# Patient Record
Sex: Male | Born: 1960 | Race: Black or African American | Hispanic: No | Marital: Single | Smoking: Current every day smoker
Health system: Southern US, Community
[De-identification: ages and names within clinical notes are randomized; demographics above are authoritative.]

## PROBLEM LIST (undated history)

## (undated) DIAGNOSIS — I10 Essential (primary) hypertension: Secondary | ICD-10-CM

## (undated) DIAGNOSIS — F1910 Other psychoactive substance abuse, uncomplicated: Secondary | ICD-10-CM

## (undated) DIAGNOSIS — B20 Human immunodeficiency virus [HIV] disease: Secondary | ICD-10-CM

## (undated) DIAGNOSIS — F32A Depression, unspecified: Secondary | ICD-10-CM

## (undated) DIAGNOSIS — F99 Mental disorder, not otherwise specified: Secondary | ICD-10-CM

## (undated) DIAGNOSIS — F329 Major depressive disorder, single episode, unspecified: Secondary | ICD-10-CM

## (undated) DIAGNOSIS — Z8659 Personal history of other mental and behavioral disorders: Secondary | ICD-10-CM

## (undated) DIAGNOSIS — Z21 Asymptomatic human immunodeficiency virus [HIV] infection status: Secondary | ICD-10-CM

## (undated) HISTORY — PX: FRACTURE SURGERY: SHX138

## (undated) HISTORY — PX: CHOLECYSTECTOMY: SHX55

---

## 1999-12-08 ENCOUNTER — Emergency Department (HOSPITAL_COMMUNITY): Admission: EM | Admit: 1999-12-08 | Discharge: 1999-12-08 | Payer: Self-pay | Admitting: Emergency Medicine

## 1999-12-16 ENCOUNTER — Emergency Department (HOSPITAL_COMMUNITY): Admission: EM | Admit: 1999-12-16 | Discharge: 1999-12-16 | Payer: Self-pay | Admitting: Emergency Medicine

## 1999-12-19 ENCOUNTER — Ambulatory Visit (HOSPITAL_BASED_OUTPATIENT_CLINIC_OR_DEPARTMENT_OTHER): Admission: RE | Admit: 1999-12-19 | Discharge: 1999-12-19 | Payer: Self-pay | Admitting: Surgery

## 2013-02-03 ENCOUNTER — Encounter (HOSPITAL_COMMUNITY): Payer: Self-pay | Admitting: *Deleted

## 2013-02-03 ENCOUNTER — Emergency Department (HOSPITAL_COMMUNITY)
Admission: EM | Admit: 2013-02-03 | Discharge: 2013-02-03 | Disposition: A | Discharge status: Home / Self Care | Payer: Medicare Other | Attending: Emergency Medicine | Admitting: Emergency Medicine

## 2013-02-03 ENCOUNTER — Emergency Department (HOSPITAL_COMMUNITY): Payer: Medicare Other

## 2013-02-03 DIAGNOSIS — S63509A Unspecified sprain of unspecified wrist, initial encounter: Secondary | ICD-10-CM | POA: Insufficient documentation

## 2013-02-03 DIAGNOSIS — W010XXA Fall on same level from slipping, tripping and stumbling without subsequent striking against object, initial encounter: Secondary | ICD-10-CM | POA: Insufficient documentation

## 2013-02-03 DIAGNOSIS — S63501A Unspecified sprain of right wrist, initial encounter: Secondary | ICD-10-CM

## 2013-02-03 DIAGNOSIS — I10 Essential (primary) hypertension: Secondary | ICD-10-CM | POA: Insufficient documentation

## 2013-02-03 DIAGNOSIS — S62306A Unspecified fracture of fifth metacarpal bone, right hand, initial encounter for closed fracture: Secondary | ICD-10-CM

## 2013-02-03 DIAGNOSIS — IMO0002 Reserved for concepts with insufficient information to code with codable children: Secondary | ICD-10-CM | POA: Insufficient documentation

## 2013-02-03 DIAGNOSIS — Y939 Activity, unspecified: Secondary | ICD-10-CM | POA: Insufficient documentation

## 2013-02-03 DIAGNOSIS — S62309A Unspecified fracture of unspecified metacarpal bone, initial encounter for closed fracture: Secondary | ICD-10-CM | POA: Insufficient documentation

## 2013-02-03 DIAGNOSIS — Z21 Asymptomatic human immunodeficiency virus [HIV] infection status: Secondary | ICD-10-CM | POA: Insufficient documentation

## 2013-02-03 DIAGNOSIS — Y92009 Unspecified place in unspecified non-institutional (private) residence as the place of occurrence of the external cause: Secondary | ICD-10-CM | POA: Insufficient documentation

## 2013-02-03 DIAGNOSIS — F17200 Nicotine dependence, unspecified, uncomplicated: Secondary | ICD-10-CM | POA: Insufficient documentation

## 2013-02-03 HISTORY — DX: Essential (primary) hypertension: I10

## 2013-02-03 HISTORY — DX: Human immunodeficiency virus (HIV) disease: B20

## 2013-02-03 HISTORY — DX: Asymptomatic human immunodeficiency virus (hiv) infection status: Z21

## 2013-02-03 MED ORDER — HYDROCODONE-ACETAMINOPHEN 5-325 MG PO TABS: 1.0000 | ORAL_TABLET | Freq: Four times a day (QID) | ORAL | Status: DC | PRN

## 2013-02-03 MED ORDER — HYDROCODONE-ACETAMINOPHEN 5-325 MG PO TABS
2.0000 | ORAL_TABLET | Freq: Once | ORAL | Status: AC
  Administered 2013-02-03: 2 via ORAL
  Filled 2013-02-03: qty 2

## 2013-02-03 NOTE — ED Notes (Signed)
Pt reports falling backwards last night and catching self with hands. C/o R wrist pain.

## 2013-02-03 NOTE — ED Provider Notes (Addendum)
History     CSN: [id number]  Arrival date & time 02/03/13  4098   First MD Initiated Contact with Patient 02/03/13 [phone]      Chief Complaint  Patient presents with  . Wrist Pain    (Consider location/radiation/quality/duration/timing/severity/associated sxs/prior treatment) HPI Pt with hx hiv, states slipped on ice at friends house last pm. L[place]y Seat back. Braced fall w outstretched right hand. C/o constant, dull, moderate, non radiating right hand and wrist pain since fall. Superficial abrasion to palm of right hand. Tetanus utd. No elbow or shoulder pain. Denies head injury or headache. No neck or back pain. No numbness/weakness. Denies any other pain or injury. States otherwise recent health at baseline.   Past Medical History  Diagnosis Date  . HIV (human immunodeficiency virus infection)   . Hypertension     Past Surgical History  Procedure Laterality Date  . Cholecystectomy      No family history on file.  History  Substance Use Topics  . Smoking status: Current Every Day Smoker  . Smokeless tobacco: Not on file  . Alcohol Use: Yes     Comment: 1-2x/wk      Review of Systems  Constitutional: Negative for fever.  HENT: Negative for neck pain.   Gastrointestinal: Negative for nausea, vomiting and abdominal pain.  Musculoskeletal: Negative for back pain.  Skin: Positive for wound.  Neurological: Negative for weakness, numbness and headaches.    Allergies  Review of patient's allergies indicates no known allergies.  Home Medications  No current outpatient prescriptions on file.  BP 113/83  Pulse 70  Temp(Src) 97.5 F (36.4 C) (Oral)  Resp 18  SpO2 99%  Physical Exam  Nursing note and vitals reviewed. Constitutional: He is oriented to person, place, and time. He appears well-developed and well-nourished. No distress.  HENT:  Head: Atraumatic.  Eyes: Conjunctivae are normal.  Neck: Normal range of motion. Neck supple. No tracheal deviation present.   Cardiovascular: Normal rate.   Pulmonary/Chest: Effort normal. No accessory muscle usage. No respiratory distress. He exhibits no tenderness.  Abdominal: Soft. He exhibits no distension. There is no tenderness.  Musculoskeletal: Normal range of motion.  Tenderness right wrist and hand. Very small 2-3 mm, ver superficial abrasion to palm of right hand at base of hypothenar region. Radial pulse 2+, normal cap refill distally.  CTLS spine, non tender, aligned, no step off.   Neurological: He is alert and oriented to person, place, and time.  Motor intact bil. Steady gait. R/U/M m fxn intact rue.   Skin: Skin is warm and dry.  Psychiatric: He has a normal mood and affect.    ED Course  Procedures (including critical care time)   Dg Wrist Complete Right  02/03/2013  *RADIOLOGY REPORT*  Clinical Data: Pain post fall  RIGHT WRIST - COMPLETE 3+ VIEW  Comparison: None.  Findings: Five views of the right wrist submitted. No acute fracture or subluxation.  Old fracture of the ulnar styloid.  Narrowing of radiocarpal joint space.  Old appearing fracture of the fifth metacarpal.  IMPRESSION: No acute fracture or subluxation.  Old fracture of the ulnar styloid.  Narrowing of radiocarpal joint space.   Original Report Authenticated By: Natasha Mead, M.D.    Dg Hand Complete Right  02/03/2013  *RADIOLOGY REPORT*  Clinical Data: Hand pain  RIGHT HAND - COMPLETE 3+ VIEW  Comparison: None.  Findings: Three views of the right hand submitted. No acute fracture or subluxation.  Old appearing fracture of the fifth  metacarpal.  IMPRESSION: No acute fracture or subluxation.  Old appearing fracture of fifth metacarpal.  Clinical correlation is necessary.   Original Report Authenticated By: Natasha Mead, M.D.       MDM  Xrays.  No meds pta. Took ambulance here. vicodin po.  Reviewed nursing notes and prior charts for additional history.   Recheck no focal scaphoid tenderness.  Pt does have tenderness over 5th  metacarpal, and denies known prior fracture of hand.  Xray results noted, ?possible acute fx. Given tenderness in region, will have ortho tech apply a ulnar gutter splint, ortho f/u.  Discussed possible fx w pt and plan for f/u.         Suzi Roots, MD 02/03/13 1610  Suzi Roots, MD 02/03/13 [phone]

## 2013-02-03 NOTE — ED Notes (Signed)
Patient transported to X-ray 

## 2013-02-03 NOTE — ED Notes (Signed)
ortho notified about splint order.

## 2013-03-07 ENCOUNTER — Emergency Department (HOSPITAL_COMMUNITY)
Admission: EM | Admit: 2013-03-07 | Discharge: 2013-03-08 | Disposition: A | Discharge status: Other Acute Inpatient Hospital | Payer: Medicare Other | Source: Home / Self Care | Attending: Emergency Medicine | Admitting: Emergency Medicine

## 2013-03-07 ENCOUNTER — Encounter (HOSPITAL_COMMUNITY): Payer: Self-pay | Admitting: Emergency Medicine

## 2013-03-07 DIAGNOSIS — R45851 Suicidal ideations: Secondary | ICD-10-CM

## 2013-03-07 HISTORY — DX: Major depressive disorder, single episode, unspecified: F32.9

## 2013-03-07 HISTORY — DX: Depression, unspecified: F32.A

## 2013-03-07 HISTORY — DX: Mental disorder, not otherwise specified: F99

## 2013-03-07 HISTORY — DX: Personal history of other mental and behavioral disorders: Z86.59

## 2013-03-07 HISTORY — DX: Other psychoactive substance abuse, uncomplicated: F19.10

## 2013-03-07 LAB — RAPID URINE DRUG SCREEN, HOSP PERFORMED
Barbiturates: NOT DETECTED
Benzodiazepines: NOT DETECTED
Cocaine: POSITIVE — AB

## 2013-03-07 LAB — CBC WITH DIFFERENTIAL/PLATELET
Band Neutrophils: 0 % (ref 0–10)
Eosinophils Relative: 4 % (ref 0–5)
HCT: 35.2 % — ABNORMAL LOW (ref 39.0–52.0)
Hemoglobin: 13 g/dL (ref 13.0–17.0)
Lymphocytes Relative: 32 % (ref 12–46)
Lymphs Abs: 1.2 10*3/uL (ref 0.7–4.0)
MCH: 33 pg (ref 26.0–34.0)
MCV: 89.3 fL (ref 78.0–100.0)
Monocytes Absolute: 0.4 10*3/uL (ref 0.1–1.0)
Monocytes Relative: 12 % (ref 3–12)
RBC: 3.94 MIL/uL — ABNORMAL LOW (ref 4.22–5.81)

## 2013-03-07 LAB — COMPREHENSIVE METABOLIC PANEL
CO2: 25 mEq/L (ref 19–32)
Calcium: 8.5 mg/dL (ref 8.4–10.5)
Chloride: 102 mEq/L (ref 96–112)
Creatinine, Ser: 0.94 mg/dL (ref 0.50–1.35)
GFR calc Af Amer: >90 mL/min (ref >90)
GFR calc non Af Amer: >90 mL/min (ref >90)
Glucose, Bld: 102 mg/dL — ABNORMAL HIGH (ref 70–99)
Total Bilirubin: 0.8 mg/dL (ref 0.3–1.2)

## 2013-03-07 LAB — ETHANOL: Alcohol, Ethyl (B): <11 mg/dL (ref 0–11)

## 2013-03-07 LAB — SALICYLATE LEVEL: Salicylate Lvl: 2 mg/dL — ABNORMAL LOW (ref 2.8–20.0)

## 2013-03-07 MED ORDER — HYDROCODONE-ACETAMINOPHEN 5-325 MG PO TABS: 1.0000 | ORAL_TABLET | Freq: Four times a day (QID) | ORAL | Status: DC | PRN

## 2013-03-07 MED ORDER — ABACAVIR SULFATE-LAMIVUDINE 600-300 MG PO TABS
1.0000 | ORAL_TABLET | Freq: Every day | ORAL | Status: DC
  Filled 2013-03-07 (×2): qty 1

## 2013-03-07 MED ORDER — ONDANSETRON HCL 8 MG PO TABS: 4.0000 mg | ORAL_TABLET | Freq: Three times a day (TID) | ORAL | Status: DC | PRN

## 2013-03-07 MED ORDER — LORAZEPAM 1 MG PO TABS: 1.0000 mg | ORAL_TABLET | Freq: Three times a day (TID) | ORAL | Status: DC | PRN

## 2013-03-07 MED ORDER — DARUNAVIR ETHANOLATE 800 MG PO TABS
800.0000 mg | ORAL_TABLET | Freq: Every day | ORAL | Status: DC
  Administered 2013-03-07: 800 mg via ORAL
  Filled 2013-03-07: qty 1

## 2013-03-07 MED ORDER — NICOTINE 14 MG/24HR TD PT24
14.0000 mg | MEDICATED_PATCH | Freq: Every day | TRANSDERMAL | Status: DC
  Filled 2013-03-07 (×2): qty 1

## 2013-03-07 MED ORDER — ACETAMINOPHEN 325 MG PO TABS: 650.0000 mg | ORAL_TABLET | ORAL | Status: DC | PRN

## 2013-03-07 MED ORDER — ZOLPIDEM TARTRATE 5 MG PO TABS: 5.0000 mg | ORAL_TABLET | Freq: Every evening | ORAL | Status: DC | PRN

## 2013-03-07 MED ORDER — ABACAVIR SULFATE 300 MG PO TABS
600.0000 mg | ORAL_TABLET | Freq: Every day | ORAL | Status: DC
  Administered 2013-03-07: 600 mg via ORAL
  Filled 2013-03-07: qty 2

## 2013-03-07 MED ORDER — AMLODIPINE BESYLATE 10 MG PO TABS
10.0000 mg | ORAL_TABLET | Freq: Every day | ORAL | Status: DC
  Administered 2013-03-07: 10 mg via ORAL
  Filled 2013-03-07 (×2): qty 1

## 2013-03-07 MED ORDER — ATENOLOL 25 MG PO TABS
50.0000 mg | ORAL_TABLET | Freq: Every day | ORAL | Status: DC
  Administered 2013-03-07: 50 mg via ORAL
  Filled 2013-03-07: qty 2

## 2013-03-07 MED ORDER — RITONAVIR 100 MG PO CAPS
100.0000 mg | ORAL_CAPSULE | Freq: Every day | ORAL | Status: DC
  Administered 2013-03-07: 100 mg via ORAL
  Filled 2013-03-07: qty 1

## 2013-03-07 MED ORDER — LAMIVUDINE 150 MG PO TABS
300.0000 mg | ORAL_TABLET | Freq: Every day | ORAL | Status: DC
  Administered 2013-03-07: 300 mg via ORAL
  Filled 2013-03-07: qty 2

## 2013-03-07 MED ORDER — POTASSIUM CHLORIDE CRYS ER 20 MEQ PO TBCR
20.0000 meq | EXTENDED_RELEASE_TABLET | Freq: Every day | ORAL | Status: DC
  Administered 2013-03-07: 20 meq via ORAL
  Filled 2013-03-07: qty 1

## 2013-03-07 MED ORDER — ALUM & MAG HYDROXIDE-SIMETH 200-200-20 MG/5ML PO SUSP: 30.0000 mL | ORAL | Status: DC | PRN

## 2013-03-07 MED ORDER — IBUPROFEN 200 MG PO TABS: 600.0000 mg | ORAL_TABLET | Freq: Three times a day (TID) | ORAL | Status: DC | PRN

## 2013-03-07 NOTE — ED Provider Notes (Signed)
Patient reports feeling suicidal since this morning. He has thought about jumping out in front of a car. Patient is alert awake depressed affect . Medical decision making feel the patient requires inpatient psychiatric evaluation and therapy . Will obtain telemetry psychiatry consult Act consult for placement  Doug Sou, MD 03/07/13 1754

## 2013-03-07 NOTE — ED Notes (Signed)
Pt reports woke up with suicidal thoughts this morning. Pt reports trigger occurred due to arguing with family over HIV status. Pt reports was staying with his sister, who commented while he was cooking that he could be putting blood in the food. Pt stayed with cousin and when pt told cousin about his HIV status her response was that he needed to find somewhere else to live. Pt has no other housing options. Pt plan is to jump in front of a car.

## 2013-03-07 NOTE — ED Notes (Signed)
RN spoke with on-call telepsych psychiatrist. Psychiatrist is now speaking with pt in room. Sitter is present at the bedside.

## 2013-03-07 NOTE — ED Provider Notes (Deleted)
Medical screening examination/treatment/procedure(s) were performed by non-physician practitioner and as supervising physician I was immediately available for consultation/collaboration.  Doug Sou, MD 03/07/13 [phone]

## 2013-03-07 NOTE — BH Assessment (Addendum)
Assessment Note   Sean Robinson is an 52 y.o. male that presented to [facility] voluntarily with SI and a plan to "to jump in front of a car". Pt said "I'm not safe out there, I need to be inpatient, I don't feel safe". Pt denies HI, AVH, or psychosis. Pt is oriented x's 4, alert, calm and cooperative and tearful during assessment. Pt denies sexual, physical or emotional abuse. Pt confirms a sa hx with an etoh onset at 52 yo and a crack cocaine onset at 52 yo; with last use of both on 03/05/13. Pt confirms that "I smoke five cigarettes a day". Pt reports that his recent memory is intact and his remote memory is not intact. Pt confirms depressive symptoms of hopeless, fatigue, guilt, tearful, loss of interest in usual pleasure, feeling irritable, feeling worthless and self-pity and insomnia (5-6/24). Pt denies any family hx of suicide or mh problems. Pt confirms no pain (0/10) and he is able to complete all ADL's without assistance. Pt reports that his support system is nonexistent and when asked if his family support him he said "no, none of them". Pt reports recent stress and conflict was telling his family his HIV status. Pt reports that he has never attempted in the past, yet he has been hospitalized for SI and depression. Pt has 2 inpatient for SI and depression ([facility] & [facility]-[facility] hospital) and 2 inpatient for [facility] (30 yrs ago) & [facility]-not sure of name). Pt reports that he was dx'd with HIV in 1996 and said "I see Dr. Jenelle Mages at [facility] and when I saw her last month I was undetected (viral load) and 450 (T-Cell). Pt was put out of his nieces home in [place] because of his status and he moved to [place] with a cousin. Pt reports that when he told his cousin, "I was told that I needed to find somewhere else to live". Pt reports he is not able to live with any of his family, as one of them made a public announcement outside of where he was living. Pt said that he needs to find housing and "I get a  SSI check monthly". Denice Bors, AADC 03/07/2013 6:45 PM  Axis I: Major Depression, Recurrent severe Axis II: Deferred Axis III:  Past Medical History  Diagnosis Date  . HIV (human immunodeficiency virus infection)   . Hypertension   . Mental disorder   . Depression   . H/O psychiatric hospitalization     [facility], [place] - 2007 and [facility] - 2008  . Polysubstance abuse     ETOH and crack   Axis IV: housing problems, other psychosocial or environmental problems, problems related to social environment and problems with primary support group Axis V: 31-40 impairment in reality testing  Past Medical History:  Past Medical History  Diagnosis Date  . HIV (human immunodeficiency virus infection)   . Hypertension   . Mental disorder   . Depression   . H/O psychiatric hospitalization     [facility], [place] - 2007 and [facility] - 2008  . Polysubstance abuse     ETOH and crack    Past Surgical History  Procedure Laterality Date  . Cholecystectomy      Family History: No family history on file.  Social History:  reports that he has been smoking.  He does not have any smokeless tobacco history on file. He reports that  drinks alcohol. He reports that he uses illicit drugs (  Cocaine).  Additional Social History:  Alcohol / Drug Use Pain Medications: pt denies Prescriptions: pt denies Over the Counter: pt denies History of alcohol / drug use?: Yes Longest period of sobriety (when/how long): pt unsure Negative Consequences of Use: Financial;Personal relationships Substance #1 Name of Substance 1: etoh 1 - Age of First Use: 12 1 - Last Use / Amount: 03/05/13 Substance #2 Name of Substance 2: crack cocaine 2 - Age of First Use: 26 2 - Last Use / Amount: 03/05/13  CIWA: CIWA-Ar BP: 130/86 mmHg (Simultaneous filing. User may not have seen previous data.) Pulse Rate: 64 COWS:    Allergies: No Known Allergies  Home Medications:  (Not in a hospital  admission)  OB/GYN Status:  No LMP for male patient.  General Assessment Data Location of Assessment: [facility] ED Living Arrangements: Other (Comment) (recently homeless) Can pt return to current living arrangement?: No Admission Status: Voluntary Is patient capable of signing voluntary admission?: Yes Transfer from: Home Referral Source: Self/Family/Friend     Risk to self Suicidal Ideation: Yes-Currently Present Suicidal Intent: No Is patient at risk for suicide?: Yes Suicidal Plan?: Yes-Currently Present Specify Current Suicidal Plan:  (pt reports that he would step in front of a car) Access to Means: Yes Specify Access to Suicidal Means:  (pt would step in path of motor vehicle) What has been your use of drugs/alcohol within the last 12 months?:  (etoh, crack cocaine) Previous Attempts/Gestures: No How many times?:  (0) Other Self Harm Risks:  (none noted) Triggers for Past Attempts: None known Intentional Self Injurious Behavior: None Family Suicide History: No Recent stressful life event(s): Conflict (Comment);Financial Problems (family conflict) Persecutory voices/beliefs?: No Depression: Yes Depression Symptoms: Fatigue;Isolating;Tearfulness;Insomnia;Guilt;Loss of interest in usual pleasures;Feeling worthless/self pity;Feeling angry/irritable (pt reports being irritable, not angry) Substance abuse history and/or treatment for substance abuse?: Yes Suicide prevention information given to non-admitted patients: Not applicable  Risk to Others Homicidal Ideation: No Thoughts of Harm to Others: No Current Homicidal Intent: No Current Homicidal Plan: No Access to Homicidal Means: No Identified Victim:  (none noted) History of harm to others?: No Assessment of Violence: None Noted Violent Behavior Description:  (none noted, pt calm and cooperative) Does patient have access to weapons?: No Criminal Charges Pending?: No Does patient have a court date:  No  Psychosis Hallucinations: None noted Delusions: None noted  Mental Status Report Appear/Hygiene:  (pt in hospital wear) Eye Contact: Good Motor Activity: Freedom of movement Speech: Logical/coherent Level of Consciousness: Alert Mood: Depressed;Sad (tearful during assessment) Affect: Appropriate to circumstance Anxiety Level: Moderate Thought Processes: Coherent;Relevant Judgement: Unimpaired Orientation: Person;Place;Time;Situation;Appropriate for developmental age Obsessive Compulsive Thoughts/Behaviors: None  Cognitive Functioning Concentration: Decreased Memory: Recent Intact;Remote Impaired IQ: Average Insight: Fair Impulse Control: Poor Appetite: Fair Weight Loss:  (0) Weight Gain:  (0) Sleep: Decreased Total Hours of Sleep:  (5-6/24) Vegetative Symptoms: Not bathing;Decreased grooming  ADLScreening [facility] Assessment Services) Patient's cognitive ability adequate to safely complete daily activities?: Yes Patient able to express need for assistance with ADLs?: Yes Independently performs ADLs?: Yes (appropriate for developmental age)  Abuse/Neglect [facility]) Physical Abuse: Denies Verbal Abuse: Denies Sexual Abuse: Denies  Prior Inpatient Therapy Prior Inpatient Therapy: Yes Prior Therapy Dates:  (2011, 2010, 1984) Prior Therapy Facilty/Provider(s):  ([facility], [place], [place]) Reason for Treatment:  (depression x'2, sa x'1)  Prior Outpatient Therapy Prior Outpatient Therapy: Yes Prior Therapy Dates:  (2004) Prior Therapy Facilty/Provider(s):  [facility]) Reason for Treatment:  (detox)  ADL Screening (condition at time  of admission) Patient's cognitive ability adequate to safely complete daily activities?: Yes Patient able to express need for assistance with ADLs?: Yes Independently performs ADLs?: Yes (appropriate for developmental age) Weakness of Legs: None Weakness of Arms/Hands: None  Home Assistive Devices/Equipment Home Assistive  Devices/Equipment: None  Therapy Consults (therapy consults require a physician order) PT Evaluation Needed: No OT Evalulation Needed: No SLP Evaluation Needed: No Abuse/Neglect Assessment (Assessment to be complete while patient is alone) Physical Abuse: Denies Verbal Abuse: Denies Sexual Abuse: Denies Exploitation of patient/patient's resources: Denies Self-Neglect: Denies Values / Beliefs Cultural Requests During Hospitalization: None Spiritual Requests During Hospitalization: None Consults Spiritual Care Consult Needed: No Social Work Consult Needed: No Merchant navy officer (For Healthcare) Advance Directive: Patient does not have advance directive Pre-existing out of facility DNR order (yellow form or pink MOST form): No Nutrition Screen- [facility] Adult/WL/AP Patient's home diet: Regular Have you recently lost weight without trying?: No Have you been eating poorly because of a decreased appetite?: No Malnutrition Screening Tool Score: 0  Additional Information 1:1 In Past 12 Months?: No CIRT Risk: No Elopement Risk: No Does patient have medical clearance?: Yes     Disposition: Dr. Felix Pacini will place order for tele-psych. Disposition Initial Assessment Completed for this Encounter: Yes Disposition of Patient: Inpatient treatment program Type of inpatient treatment program: Adult  On Site Evaluation by:   Reviewed with Physician:     Manual Meier 03/07/2013 6:11 PM

## 2013-03-07 NOTE — ED Notes (Signed)
Pt ambulated to restroom. 

## 2013-03-07 NOTE — ED Provider Notes (Signed)
Assumed care of the patient from [place].  The patient was awaiting lab results.  He appears to be stable for psych eval for suicidal ideation with plan.  I have given report to the act team. 5:00 PM BP 131/86  Pulse 69  Temp(Src) 97.8 F (36.6 C) (Oral)  Resp 18  SpO2 98%   Arthor Captain, PA-C 03/07/13 1700

## 2013-03-07 NOTE — ED Provider Notes (Signed)
History     CSN: [id number]  Arrival date & time 03/07/13  1424   First MD Initiated Contact with Patient 03/07/13 1426      Chief Complaint  Patient presents with  . Suicidal    (Consider location/radiation/quality/duration/timing/severity/associated sxs/prior treatment) HPI Comments: Patient reports his family has kicked him out due to his HIV status.  States his niece yelled his HIV status to the entire neighborhood at his sister's house, so he had to leave town.  He then moved to [place] with his cousin, and when his cousin found out he was HIV positive he was kicked out.  States he stayed on the street where he drank ETOH and did crack, though this is not a daily habit for him.  States he is having suicidal thoughts and plans to jump in front of a car.  States "The HIV is killing me anyway, so I might as well go ahead and get it over with."  Denies HI.    The history is provided by the patient.    Past Medical History  Diagnosis Date  . HIV (human immunodeficiency virus infection)   . Hypertension     Past Surgical History  Procedure Laterality Date  . Cholecystectomy      No family history on file.  History  Substance Use Topics  . Smoking status: Current Every Day Smoker  . Smokeless tobacco: Not on file  . Alcohol Use: Yes     Comment: 1-2x/wk      Review of Systems  Constitutional: Negative for fever and chills.  HENT: Positive for congestion. Negative for sore throat.   Respiratory: Positive for cough. Negative for shortness of breath.        Regarding cough and nasal congestion; has "summer cold" - described as mild.   Cardiovascular: Negative for chest pain.  Gastrointestinal: Negative for nausea, vomiting, abdominal pain and diarrhea.  Neurological: Negative for weakness and numbness.    Allergies  Review of patient's allergies indicates no known allergies.  Home Medications   Current Outpatient Rx  Name  Route  Sig  Dispense  Refill  .  abacavir-lamiVUDine (EPZICOM) 600-300 MG per tablet   Oral   Take 1 tablet by mouth daily.         Marland Kitchen amLODipine (NORVASC) 10 MG tablet   Oral   Take 10 mg by mouth daily.         Marland Kitchen atenolol (TENORMIN) 50 MG tablet   Oral   Take 50 mg by mouth daily.         . Darunavir Ethanolate (PREZISTA) 800 MG tablet   Oral   Take 800 mg by mouth daily.         Marland Kitchen HYDROcodone-acetaminophen (NORCO/VICODIN) 5-325 MG per tablet   Oral   Take 1-2 tablets by mouth every 6 (six) hours as needed for pain.         . potassium chloride SA (K-DUR,KLOR-CON) 20 MEQ tablet   Oral   Take 20 mEq by mouth daily.         . ritonavir (NORVIR) 100 MG capsule   Oral   Take 100 mg by mouth daily.           BP 134/84  Pulse 86  Temp(Src) 97.8 F (36.6 C) (Oral)  Resp 18  SpO2 98%  Physical Exam  Nursing note and vitals reviewed. Constitutional: He appears well-developed and well-nourished. No distress.  HENT:  Head: Normocephalic and atraumatic.  Neck: Neck supple.  Cardiovascular: Normal rate and regular rhythm.   Pulmonary/Chest: Effort normal and breath sounds normal. No respiratory distress. He has no wheezes. He has no rales.  Abdominal: Soft. He exhibits no distension and no mass. There is no tenderness. There is no rebound and no guarding.  Musculoskeletal:       Right knee: He exhibits swelling. He exhibits normal range of motion, no deformity, normal alignment, no LCL laxity and no MCL laxity.  Lower extremities:  Strength 5/5, sensation intact, distal pulses intact.     Neurological: He is alert. He exhibits normal muscle tone.  Skin: He is not diaphoretic.  Psychiatric: He is slowed. He exhibits a depressed mood. He expresses suicidal ideation. He expresses suicidal plans.    ED Course  Procedures (including critical care time)  Labs Reviewed  CBC WITH DIFFERENTIAL  COMPREHENSIVE METABOLIC PANEL  URINE RAPID DRUG SCREEN (HOSP PERFORMED)  ETHANOL  ACETAMINOPHEN  LEVEL  SALICYLATE LEVEL   No results found.   1. Suicidal ideation     MDM  Pt with suicidal ideation and plan.  Pt is voluntary at present.  Discussed patient with Arthor Captain, PA-C, who assumes care of patient at change of shift pending all labs and consult to ACT team for placement.         Trixie Dredge, PA-C 03/07/13 1526

## 2013-03-07 NOTE — ED Provider Notes (Signed)
Medical screening examination/treatment/procedure(s) were performed by non-physician practitioner and as supervising physician I was immediately available for consultation/collaboration.   Richardean Canal, MD 03/07/13 2253

## 2013-03-08 ENCOUNTER — Inpatient Hospital Stay (HOSPITAL_COMMUNITY)
Admission: EM | Admit: 2013-03-08 | Discharge: 2013-03-12 | DRG: 881 | Payer: Medicare Other | Source: Intra-hospital | Attending: Psychiatry | Admitting: Psychiatry

## 2013-03-08 ENCOUNTER — Encounter (HOSPITAL_COMMUNITY): Payer: Self-pay | Admitting: *Deleted

## 2013-03-08 DIAGNOSIS — F332 Major depressive disorder, recurrent severe without psychotic features: Secondary | ICD-10-CM

## 2013-03-08 DIAGNOSIS — F1410 Cocaine abuse, uncomplicated: Secondary | ICD-10-CM | POA: Diagnosis present

## 2013-03-08 DIAGNOSIS — F329 Major depressive disorder, single episode, unspecified: Principal | ICD-10-CM | POA: Diagnosis present

## 2013-03-08 DIAGNOSIS — Z21 Asymptomatic human immunodeficiency virus [HIV] infection status: Secondary | ICD-10-CM | POA: Diagnosis present

## 2013-03-08 DIAGNOSIS — R45851 Suicidal ideations: Secondary | ICD-10-CM

## 2013-03-08 DIAGNOSIS — I10 Essential (primary) hypertension: Secondary | ICD-10-CM | POA: Diagnosis present

## 2013-03-08 DIAGNOSIS — F3289 Other specified depressive episodes: Principal | ICD-10-CM | POA: Diagnosis present

## 2013-03-08 DIAGNOSIS — Z79899 Other long term (current) drug therapy: Secondary | ICD-10-CM

## 2013-03-08 DIAGNOSIS — F4323 Adjustment disorder with mixed anxiety and depressed mood: Secondary | ICD-10-CM

## 2013-03-08 MED ORDER — SERTRALINE HCL 50 MG PO TABS
50.0000 mg | ORAL_TABLET | Freq: Every day | ORAL | Status: DC
  Administered 2013-03-08 – 2013-03-12 (×5): 50 mg via ORAL
  Filled 2013-03-08 (×8): qty 1

## 2013-03-08 MED ORDER — POTASSIUM CHLORIDE CRYS ER 20 MEQ PO TBCR
20.0000 meq | EXTENDED_RELEASE_TABLET | Freq: Every day | ORAL | Status: DC
  Administered 2013-03-08 – 2013-03-12 (×5): 20 meq via ORAL
  Filled 2013-03-08 (×7): qty 1

## 2013-03-08 MED ORDER — OLANZAPINE 5 MG PO TABS: 5.0000 mg | ORAL_TABLET | Freq: Two times a day (BID) | ORAL | Status: DC | PRN

## 2013-03-08 MED ORDER — DARUNAVIR ETHANOLATE 800 MG PO TABS
800.0000 mg | ORAL_TABLET | Freq: Every day | ORAL | Status: DC
  Administered 2013-03-08 – 2013-03-12 (×5): 800 mg via ORAL
  Filled 2013-03-08 (×6): qty 1

## 2013-03-08 MED ORDER — ABACAVIR SULFATE 300 MG PO TABS
600.0000 mg | ORAL_TABLET | Freq: Every day | ORAL | Status: DC
  Administered 2013-03-08 – 2013-03-12 (×5): 600 mg via ORAL
  Filled 2013-03-08 (×7): qty 2

## 2013-03-08 MED ORDER — TRAZODONE HCL 50 MG PO TABS
50.0000 mg | ORAL_TABLET | Freq: Every evening | ORAL | Status: DC | PRN
  Administered 2013-03-08 – 2013-03-09 (×2): 50 mg via ORAL
  Filled 2013-03-08 (×2): qty 1

## 2013-03-08 MED ORDER — RITONAVIR 100 MG PO TABS
100.0000 mg | ORAL_TABLET | Freq: Every day | ORAL | Status: DC
  Administered 2013-03-08 – 2013-03-12 (×5): 100 mg via ORAL
  Filled 2013-03-08 (×7): qty 1

## 2013-03-08 MED ORDER — MAGNESIUM HYDROXIDE 400 MG/5ML PO SUSP: 30.0000 mL | Freq: Every day | ORAL | Status: DC | PRN

## 2013-03-08 MED ORDER — ACETAMINOPHEN 325 MG PO TABS
650.0000 mg | ORAL_TABLET | Freq: Four times a day (QID) | ORAL | Status: DC | PRN
Start: 2013-03-08 — End: 2013-03-12

## 2013-03-08 MED ORDER — ATENOLOL 50 MG PO TABS
50.0000 mg | ORAL_TABLET | Freq: Every day | ORAL | Status: DC
  Administered 2013-03-08 – 2013-03-12 (×5): 50 mg via ORAL
  Filled 2013-03-08 (×7): qty 1

## 2013-03-08 MED ORDER — LAMIVUDINE 150 MG PO TABS
300.0000 mg | ORAL_TABLET | Freq: Every day | ORAL | Status: DC
  Administered 2013-03-08 – 2013-03-12 (×5): 300 mg via ORAL
  Filled 2013-03-08 (×7): qty 2

## 2013-03-08 MED ORDER — AMLODIPINE BESYLATE 10 MG PO TABS
10.0000 mg | ORAL_TABLET | Freq: Every day | ORAL | Status: DC
  Administered 2013-03-08 – 2013-03-12 (×5): 10 mg via ORAL
  Filled 2013-03-08 (×7): qty 1

## 2013-03-08 MED ORDER — ALUM & MAG HYDROXIDE-SIMETH 200-200-20 MG/5ML PO SUSP: 30.0000 mL | ORAL | Status: DC | PRN

## 2013-03-08 NOTE — H&P (Signed)
  Pt was seen by me today and I agree with the key elements documented in H&P.  

## 2013-03-08 NOTE — [facility] Group Notes (Addendum)
[facility] LCSW Group Therapy  03/08/2013   Did not attend  Sarina Ser 03/08/2013, 5:08 PM

## 2013-03-08 NOTE — [facility] Suicide Risk Assessment (Signed)
Suicide Risk Assessment  Admission Assessment     Nursing information obtained from:  Patient Demographic factors:  Male;Gay, lesbian, or bisexual orientation;Unemployed (disability) Current Mental Status:  SI thoughts no paln, NO HI, NO AVH, logical, sad mood  Loss Factors:  Conflict with family Historical Factors:  Prior suicide attempts;Impulsivity Risk Reduction Factors:  Living with another person, especially a relative  CLINICAL FACTORS:   Depression:   Hopelessness  COGNITIVE FEATURES THAT CONTRIBUTE TO RISK:  Closed-mindedness    SUICIDE RISK:   Moderate:  Frequent suicidal ideation with limited intensity, and duration, some specificity in terms of plans, no associated intent, good self-control, limited dysphoria/symptomatology, some risk factors present, and identifiable protective factors, including available and accessible social support.  PLAN OF CARE:  Start zoloft for depression and trazoodne for sleep  I certify that inpatient services furnished can reasonably be expected to improve the patient's condition.  Wonda Cerise 03/08/2013, 5:37 PM

## 2013-03-08 NOTE — Tx Team (Signed)
Initial Interdisciplinary Treatment Plan  PATIENT STRENGTHS: (choose at least two) Ability for insight Average or above average intelligence Communication skills General fund of knowledge Motivation for treatment/growth Physical Health Religious Affiliation Special hobby/interest  PATIENT STRESSORS: Health problems Marital or family conflict Substance abuse   PROBLEM LIST: Problem List/Patient Goals Date to be addressed Date deferred Reason deferred Estimated date of resolution  "To help me get better with the suicidal thoughts" 03/08/13     "I want to go into treatment for my drug and etoh use" 03/08/13           Depression 03/08/13     Increased risk for suicide 03/08/13     Substance abuse 03/08/13                        DISCHARGE CRITERIA:  Ability to meet basic life and health needs Adequate post-discharge living arrangements Improved stabilization in mood, thinking, and/or behavior Medical problems require only outpatient monitoring Motivation to continue treatment in a less acute level of care Need for constant or close observation no longer present Reduction of life-threatening or endangering symptoms to within safe limits Safe-care adequate arrangements made Verbal commitment to aftercare and medication compliance Withdrawal symptoms are absent or subacute and managed without 24-hour nursing intervention  PRELIMINARY DISCHARGE PLAN: Attend aftercare/continuing care group Attend 12-step recovery group Participate in family therapy Placement in alternative living arrangements  PATIENT/FAMIILY INVOLVEMENT: This treatment plan has been presented to and reviewed with the patient, Sean Robinson, and/or family member.  The patient and family have been given the opportunity to ask questions and make suggestions.  Fransico Michael [facility] 03/08/2013, 3:38 AM

## 2013-03-08 NOTE — H&P (Signed)
Psychiatric Admission Assessment Adult  Patient Identification:  Ruston Fedora Date of Evaluation:  03/08/2013 Chief Complaint:  MDD recurrent severe History of Present Illness:: [place] Souter is an 52 y.o. male that presented to [facility] voluntarily with SI and a plan to "to jump in front of a car". Pt said "I'm not safe out there, I need to be inpatient, I don't feel safe". Pt denies HI, AVH, or psychosis. Pt is oriented x's 4, alert, calm and cooperative and tearful during assessment. Pt denies sexual, physical or emotional abuse. Pt confirms a sa hx with an etoh onset at 52 yo and a crack cocaine onset at 52 yo; with last use of both on 03/05/13. Pt confirms that "I smoke five cigarettes a day". Pt reports that his recent memory is intact and his remote memory is not intact. Pt confirms depressive symptoms of hopeless, fatigue, guilt, tearful, loss of interest in usual pleasure, feeling irritable, feeling worthless and self-pity and insomnia (5-6/24). Pt denies any family hx of suicide or mh problems. Pt confirms no pain (0/10) and he is able to complete all ADL's without assistance. Pt reports that his support system is nonexistent and when asked if his family support him he said "no, none of them". Pt reports recent stress and conflict was telling his family his HIV status. Pt reports that he has never attempted in the past, yet he has been hospitalized for SI and depression. Pt has 2 inpatient for SI and depression ([facility] & [facility] hospital) and 2 inpatient for [facility] (30 yrs ago) & [facility] 2004-not sure of name). Pt reports that he was dx'd with HIV in 1996 and said "I see Dr. Jenelle Mages at [facility] and when I saw her last month I was undetected (viral load) and 450 (T-Cell). Pt was put out of his nieces home in [place] because of his status and he moved to [place] with a cousin. Pt reports that when he told his cousin, "I was told that I needed to find somewhere else to live". Pt reports he is not  able to live with any of his family, as one of them made a public announcement outside of where he was living. Pt said that he needs to find housing and "I get a SSI check monthly".   Patient is feeling some better since being admitted to the hospital. He feels the majority of his Depression can be attributed to being rejected by his family due to his HIV status. The patient discussed the way he feels rejected by most of society. Patient stated "Even in the shelter I sneak to take my HIV medications because I don't want anyone to know." The patient is even worried that "people can tell from my toenails turning black. I think the HIV meds turned them that way and people can tell." Patient wants to get "the SI thoughts out of my head." He is open to being started on an antidepressant. The patient appears very motivated to get better.    Associated Signs/Synptoms: Depression Symptoms:  depressed mood, feelings of worthlessness/guilt, hopelessness, recurrent thoughts of death, suicidal thoughts with specific plan, loss of energy/fatigue, disturbed sleep, (Hypo) Manic Symptoms: Denies Anxiety Symptoms:  Excessive Worry, Psychotic Symptoms:  Denies PTSD Symptoms: Denies  Psychiatric Specialty Exam: Physical Exam-Reviewed exam from the ED.  ROS  Blood pressure 119/72, pulse 75, temperature 98.6 F (37 C), temperature source Oral, resp. rate 16.There is no height or weight on file to calculate BMI.  General Appearance: Disheveled  Eye Contact::  Fair  Speech:  Clear and Coherent  Volume:  Decreased  Mood:  Depressed and Hopeless  Affect:  Congruent  Thought Process:  Goal Directed  Orientation:  Full (Time, Place, and Person)  Thought Content:  WDL  Suicidal Thoughts:  No  Homicidal Thoughts:  No  Memory:  Immediate;   Good Recent;   Good Remote;   Good  Judgement:  Fair  Insight:  Fair  Psychomotor Activity:  Normal  Concentration:  Fair  Recall:  Good  Akathisia:  No  Handed:   Right  AIMS (if indicated):     Assets:  Communication Skills Desire for Improvement Financial Resources/Insurance Leisure Time  Sleep:  Number of Hours: 1.5    Past Psychiatric History: Diagnosis:Depression and Alcohol Abuse  Hospitalizations: Several, [facility] and [facility], [facility] 30 years ago  Outpatient Care:None  Substance Abuse Care:  Self-Mutilation:None  Suicidal Attempts:"Years ago"  Violent Behaviors:None   Past Medical History:   Past Medical History  Diagnosis Date  . HIV (human immunodeficiency virus infection)   . Hypertension   . Mental disorder   . Depression   . H/O psychiatric hospitalization     [facility], [place] - 2007 and [facility] - 2008  . Polysubstance abuse     ETOH and crack   None. Allergies:  No Known Allergies PTA Medications: Prescriptions prior to admission  Medication Sig Dispense Refill  . abacavir-lamiVUDine (EPZICOM) 600-300 MG per tablet Take 1 tablet by mouth daily.      Marland Kitchen amLODipine (NORVASC) 10 MG tablet Take 10 mg by mouth daily.      Marland Kitchen atenolol (TENORMIN) 50 MG tablet Take 50 mg by mouth daily.      . Darunavir Ethanolate (PREZISTA) 800 MG tablet Take 800 mg by mouth daily.      Marland Kitchen HYDROcodone-acetaminophen (NORCO/VICODIN) 5-325 MG per tablet Take 1-2 tablets by mouth every 6 (six) hours as needed for pain.      . potassium chloride SA (K-DUR,KLOR-CON) 20 MEQ tablet Take 20 mEq by mouth daily.      . ritonavir (NORVIR) 100 MG capsule Take 100 mg by mouth daily.        Previous Psychotropic Medications:  Medication/Dose  Patient unsure of names of past antidepressant he was on years ago.                Substance Abuse History in the last 12 months:  yes  Consequences of Substance Abuse: NA  Social History:  reports that he has been smoking Cigarettes.  He has been smoking about 0.25 packs per day. He does not have any smokeless tobacco history on file. He reports that  drinks alcohol. He reports  that he uses illicit drugs (Cocaine). Additional Social History:                      Current Place of Residence:   Place of Birth:   Family Members: Marital Status:  Single Children:  Sons:  Daughters: Relationships: Education:  Corporate treasurer Problems/Performance: Religious Beliefs/Practices: History of Abuse (Emotional/Phsycial/Sexual) Teacher, music History:  None. Legal History: Hobbies/Interests:  Family History:  No family history on file.  Results for orders placed during the hospital encounter of 03/07/13 (from the past 72 hour(s))  CBC WITH DIFFERENTIAL     Status: Abnormal   Collection Time    03/07/13  3:33 PM      Result Value Range   WBC  3.7 (*) 4.0 - 10.5 K/uL   RBC 3.94 (*) 4.22 - 5.81 MIL/uL   Hemoglobin 13.0  13.0 - 17.0 g/dL   HCT 40.9 (*) 81.1 - 91.4 %   MCV 89.3  78.0 - 100.0 fL   MCH 33.0  26.0 - 34.0 pg   MCHC 36.9 (*) 30.0 - 36.0 g/dL   RDW 78.2  95.6 - 21.3 %   Platelets 135 (*) 150 - 400 K/uL   Neutrophils Relative 52  43 - 77 %   Neutro Abs 1.9  1.7 - 7.7 K/uL   Band Neutrophils 0  0 - 10 %   Lymphocytes Relative 32  12 - 46 %   Lymphs Abs 1.2  0.7 - 4.0 K/uL   Monocytes Relative 12  3 - 12 %   Monocytes Absolute 0.4  0.1 - 1.0 K/uL   Eosinophils Relative 4  0 - 5 %   Eosinophils Absolute 0.1  0.0 - 0.7 K/uL   Basophils Relative 0  0 - 1 %   Basophils Absolute 0  0.0 - 0.1 K/uL  COMPREHENSIVE METABOLIC PANEL     Status: Abnormal   Collection Time    03/07/13  3:33 PM      Result Value Range   Sodium 136  135 - 145 mEq/L   Potassium 3.6  3.5 - 5.1 mEq/L   Chloride 102  96 - 112 mEq/L   CO2 25  19 - 32 mEq/L   Glucose, Bld 102 (*) 70 - 99 mg/dL   BUN 13  6 - 23 mg/dL   Creatinine, Ser 0.86  0.50 - 1.35 mg/dL   Calcium 8.5  8.4 - 57.8 mg/dL   Total Protein 8.8 (*) 6.0 - 8.3 g/dL   Albumin 3.3 (*) 3.5 - 5.2 g/dL   AST 469 (*) 0 - 37 U/L   ALT 77 (*) 0 - 53 U/L   Alkaline Phosphatase 68  39 - 117  U/L   Total Bilirubin 0.8  0.3 - 1.2 mg/dL   GFR calc non Af Amer >90  >90 mL/min   GFR calc Af Amer >90  >90 mL/min   Comment:            The eGFR has been calculated     using the CKD EPI equation.     This calculation has not been     validated in all clinical     situations.     eGFR's persistently     <90 mL/min signify     possible Chronic Kidney Disease.  ETHANOL     Status: None   Collection Time    03/07/13  3:33 PM      Result Value Range   Alcohol, Ethyl (B) <11  0 - 11 mg/dL   Comment:            LOWEST DETECTABLE LIMIT FOR     SERUM ALCOHOL IS 11 mg/dL     FOR MEDICAL PURPOSES ONLY  ACETAMINOPHEN LEVEL     Status: None   Collection Time    03/07/13  3:33 PM      Result Value Range   Acetaminophen (Tylenol), Serum <15.0  10 - 30 ug/mL   Comment:            THERAPEUTIC CONCENTRATIONS VARY     SIGNIFICANTLY. A RANGE OF 10-30     ug/mL MAY BE AN EFFECTIVE     CONCENTRATION FOR MANY PATIENTS.  HOWEVER, SOME ARE BEST TREATED     AT CONCENTRATIONS OUTSIDE THIS     RANGE.     ACETAMINOPHEN CONCENTRATIONS     >150 ug/mL AT 4 HOURS AFTER     INGESTION AND >50 ug/mL AT 12     HOURS AFTER INGESTION ARE     OFTEN ASSOCIATED WITH TOXIC     REACTIONS.  SALICYLATE LEVEL     Status: Abnormal   Collection Time    03/07/13  3:33 PM      Result Value Range   Salicylate Lvl 2.0 (*) 2.8 - 20.0 mg/dL  URINE RAPID DRUG SCREEN (HOSP PERFORMED)     Status: Abnormal   Collection Time    03/07/13  4:46 PM      Result Value Range   Opiates NONE DETECTED  NONE DETECTED   Cocaine POSITIVE (*) NONE DETECTED   Benzodiazepines NONE DETECTED  NONE DETECTED   Amphetamines NONE DETECTED  NONE DETECTED   Tetrahydrocannabinol NONE DETECTED  NONE DETECTED   Barbiturates NONE DETECTED  NONE DETECTED   Comment:            DRUG SCREEN FOR MEDICAL PURPOSES     ONLY.  IF CONFIRMATION IS NEEDED     FOR ANY PURPOSE, NOTIFY LAB     WITHIN 5 DAYS.                LOWEST DETECTABLE LIMITS      FOR URINE DRUG SCREEN     Drug Class       Cutoff (ng/mL)     Amphetamine      1000     Barbiturate      200     Benzodiazepine   200     Tricyclics       300     Opiates          300     Cocaine          300     THC              50   Psychological Evaluations:  Assessment:   AXIS I:  Major Depression, Recurrent severe AXIS II:  Deferred AXIS III:   Past Medical History  Diagnosis Date  . HIV (human immunodeficiency virus infection)   . Hypertension   . Mental disorder   . Depression   . H/O psychiatric hospitalization     [facility], [place] - 2007 and [facility] - 2008  . Polysubstance abuse     ETOH and crack   AXIS IV:  housing problems, occupational problems, other psychosocial or environmental problems and problems with primary support group AXIS V:  41-50 serious symptoms     Treatment Plan/Recommendations:   1. Admit for crisis management and stabilization. Estimated length of stay 5-7 days. 2. Medication management to reduce current symptoms to base line and improve the patient's level of functioning. Started on Zoloft 50 mg po daily for depressive and anxious symptoms. Trazodone 50 mg initiated to help improve sleep. 3. Develop treatment plan to decrease risk of relapse upon discharge of depressive symptoms and the need for readmission. 5. Group therapy to facilitate development of healthy coping skills to use for depression and anxiety. 6. Health care follow up as needed for medical problems. Patient is receiving his HIV medications while in the hospital.  7. Discharge plan to include help with housing problems.   8. Call for Consult with Hospitalist for additional specialty  patient services as needed.   Treatment Plan Summary: Daily contact with patient to assess and evaluate symptoms and progress in treatment Medication management Current Medications:  Current Facility-Administered Medications  Medication Dose Route Frequency Provider Last Rate Last  Dose  . abacavir (ZIAGEN) tablet 600 mg  600 mg Oral Daily Sanjuana Kava, NP   600 mg at 03/08/13 0859  . acetaminophen (TYLENOL) tablet 650 mg  650 mg Oral Q6H PRN Sanjuana Kava, NP      . alum & mag hydroxide-simeth (MAALOX/MYLANTA) 200-200-20 MG/5ML suspension 30 mL  30 mL Oral Q4H PRN Sanjuana Kava, NP      . amLODipine (NORVASC) tablet 10 mg  10 mg Oral Daily Sanjuana Kava, NP   10 mg at 03/08/13 0859  . atenolol (TENORMIN) tablet 50 mg  50 mg Oral Daily Sanjuana Kava, NP   50 mg at 03/08/13 0859  . Darunavir Ethanolate (PREZISTA) tablet 800 mg  800 mg Oral Q supper Sanjuana Kava, NP      . lamiVUDine (EPIVIR) tablet 300 mg  300 mg Oral Daily Sanjuana Kava, NP      . magnesium hydroxide (MILK OF MAGNESIA) suspension 30 mL  30 mL Oral Daily PRN Sanjuana Kava, NP      . potassium chloride SA (K-DUR,KLOR-CON) CR tablet 20 mEq  20 mEq Oral Daily Sanjuana Kava, NP   20 mEq at 03/08/13 0859  . ritonavir (NORVIR) tablet 100 mg  100 mg Oral Q breakfast Sanjuana Kava, NP   100 mg at 03/08/13 0859    Observation Level/Precautions:  15 minute checks  Laboratory:  CBC Chemistry Profile UDS-positive for cocaine  Psychotherapy:  Individual and group therapy  Medications:  Patient interest in being started on an antidepressant.   Consultations:  None  Discharge Concerns:  Patient is homeless and is HIV positive.   Estimated LOS:5-7 days  Other:     I certify that inpatient services furnished can reasonably be expected to improve the patient's condition.   Fransisca Kaufmann ANN NP-C 4/6/201412:08 PM

## 2013-03-08 NOTE — Progress Notes (Signed)
Patient ID: Sean Robinson, male   DOB: [birth year], 52 y.o.   MRN: [id number]  Initially pt was anxious, guarded and abrupt with his responses. However, as the adm process continued pt became less anxious and guarded, but never completely. Pt informed the writer that he'd recently come out to his family about his HIV status. There is now conflict with his family due to his status. Family feels uneasy about pt cooking and living with them. Pt also has hx of right knee surgery apprx 15 months ago, for which he uses a cane. Writer allowed pt to have his cane and plan to pass off for morning RN to obtain an order.

## 2013-03-09 MED ORDER — ENSURE COMPLETE PO LIQD
237.0000 mL | Freq: Two times a day (BID) | ORAL | Status: DC
  Administered 2013-03-10 – 2013-03-12 (×5): 237 mL via ORAL

## 2013-03-09 NOTE — [facility] Group Notes (Signed)
[facility] LCSW Aftercare Discharge Planning Group Note   03/09/2013 1:16 PM  Participation Quality:  Patient advised he wanted to talk privately.  Valeta Paz, [name] July

## 2013-03-09 NOTE — Progress Notes (Signed)
Adult Psychoeducational Group Note  Date:  03/09/2013 Time:  6:26 PM  Group Topic/Focus:  Self Care:   The focus of this group is to help patients understand the importance of self-care in order to improve or restore emotional, physical, spiritual, interpersonal, and financial health.  Participation Level:  Active  Participation Quality:  Appropriate, Attentive and Sharing  Affect:  Appropriate  Cognitive:  Appropriate  Insight: Appropriate  Engagement in Group:  Engaged  Modes of Intervention:  Activity, Discussion and Education  Additional Comments: Jiovanni attended group and did share during group. Patient defined self care in own terms. Patient reviewed and completed the self care assessment in the workbook. Patient was asked to review and share with staff and peers about weakness and strengths of physical, spiritual, emotional, psychological, relationship self care.    Karleen Hampshire Brittini 03/09/2013, 6:26 PM

## 2013-03-09 NOTE — Tx Team (Signed)
Interdisciplinary Treatment Plan Update   Date Reviewed:  03/09/2013  Time Reviewed:  10:15 AM  Progress in Treatment:   Attending groups: Yes Participating in groups: Yes Taking medication as prescribed: Yes  Tolerating medication: Yes Family/Significant other contact made: No, but will ask for consent to contact family. Patient understands diagnosis: Yes  Discussing patient identified problems/goals with staff: Yes Medical problems stabilized or resolved: Yes Denies suicidal/homicidal ideation: Yes Patient has not harmed self or others: Yes  For review of initial/current patient goals, please see plan of care.  Estimated Length of Stay:  2-3 days  Reasons for Continued Hospitalization:  Anxiety Depression Medication stabilization  New Problems/Goals identified:    Discharge Plan or Barriers:   Home with outpatient follow up [facility] in  [place]  Additional Comments:  Patient reports admitting to hospital with suicidal ideation after family learned he is HIV positive and kicked him out of the home.  He is currently not endorsing SI/HI and rates all symptoms at five.  He is interested is follow up with [facility] in [place].  Attendees:  Patient: Sean Robinson 03/09/2013 10:15 AM   Signature: Patrick North, MD 03/09/2013 10:15 AM  Signature: 03/09/2013 10:15 AM  Signature: 03/09/2013 10:15 AM  Signature:Beverly Terrilee Croak, RN 03/09/2013 10:15 AM  Signature:  Neill Loft RN 03/09/2013 10:15 AM  Signature:  Juline Patch, LCSW 03/09/2013 10:15 AM  Signature: Silverio Decamp, PMH-NP 03/09/2013 10:15 AM  Signature:  03/09/2013 10:15 AM  Signature: 03/09/2013 10:15 AM  Signature:    Signature:    Signature:      Scribe for Treatment Team:   Juline Patch,  03/09/2013 10:15 AM

## 2013-03-09 NOTE — Progress Notes (Signed)
Nutrition Brief Note  Patient identified on the Malnutrition Screening Tool (MST) Report.  Hx includes hiv.  Data per patient:  Ht:  6'4", Wt:  182 lbs decreased from 185 lbs.  BMI=22. Patient meets criteria for wnl based on current BMI.   Current diet order is regular, patient is consuming approximately fair% of meals at this time. Labs and medications reviewed.   Patient states fair intake overall.  Would like Ensue.  Will order Ensure Complete po BID, each supplement provides 350 kcal and 13 grams of protein.  No further nutrition interventions warranted at this time. If further nutrition issues arise, please consult RD.   Oran Rein, RD, LDN Clinical Inpatient Dietitian Pager:  [phone] Weekend and after hours pager:  [phone]  '

## 2013-03-09 NOTE — Progress Notes (Signed)
Recreation Therapy Notes  Date: 04.07.2014 Time: 3:00pm  Location: [facility] Gym  Group Topic/Focus: Communication, Proofreader, Problem Solving  Participation Level:  Active  Participation Quality:  Appropriate  Affect:  Euthymic   Cognitive:  Appropriate   Additional Comments: Patient with peer completed "Trust Walk." Activity requires that patients lean on each other to walk to a pre-set point in the gym. Patient and peer successful. Patient with peers played "All Aboard." Patient with peers discussed strategy for getting all peers participating in group inside of a hula hoop. Patient with peers successful on second try. Patient actively participated in group activity. Patient participated in wrap up discussion about the importance of communication and trust building to a healthy support system.   Marykay Lex Sophira Rumler, LRT/CTRS  Uniqua Kihn L 03/09/2013 4:11 PM

## 2013-03-09 NOTE — Progress Notes (Signed)
Patient has been observed in the dayroom watching tv and minimal interaction with peers. Patient attended group and participated. Patient reports that his goal is to learn coping skills and get better. Patient went to his room shortly after snack and writer observed him lying in bed awake. Writer introduced self to patient and informed him of medication available if needed for sleep. Patient requested trazadone which he received. Patient voiced no complaints, denies si/hi/a/v hallucinations. Support and encouragement offered, safety maintained on unit, will continue to monitor with 15 min checks.

## 2013-03-09 NOTE — Progress Notes (Signed)
D:  Patient's self inventory sheet, patient has fair sleep, improvingappetite, low energy level, improving attention span.  Rated depression, hopelessness and anxiety #5.  Denied withdrawals.  Denied SI.  Denied physical problems.  Denied HI.  Denied A/V hallucinations.   Denied pain.  Did have SI thoughts during admission, but denied SI thought at this time.  Contracts for safety. A:  Medications administered per MD orders.  Support and encouragement given throughout day. R:  Following treatment plan.  Denied SI and HI.  Contracts for safety.  Denied A/V hallucinations.

## 2013-03-09 NOTE — Progress Notes (Signed)
[facility] MD Progress Note  03/09/2013 10:18 AM Sean Robinson  MRN:  [id number] Subjective:  Patient seen in treatment team. He reports feeling depressed at being rejected by family and everyone being aware of his HIV status. He is tolerating medications well, denies any side effects.  Diagnosis:   Axis I: Depressive Disorder NOS Axis II: Deferred Axis III:  Past Medical History  Diagnosis Date  . HIV (human immunodeficiency virus infection)   . Hypertension   . Mental disorder   . Depression   . H/O psychiatric hospitalization     [facility], [place] - 2007 and [facility] - 2008  . Polysubstance abuse     ETOH and crack   Axis IV: other psychosocial or environmental problems Axis V: 41-50 serious symptoms  ADL's:  Intact  Sleep: Fair  Appetite:  Fair    Psychiatric Specialty Exam: Review of Systems  Constitutional: Negative.   HENT: Negative.   Eyes: Negative.   Respiratory: Negative.   Cardiovascular: Negative.   Gastrointestinal: Negative.   Genitourinary: Negative.   Musculoskeletal: Negative.   Skin: Negative.   Neurological: Negative.   Endo/Heme/Allergies: Negative.   Psychiatric/Behavioral: Positive for depression, suicidal ideas and substance abuse. The patient is nervous/anxious.     Blood pressure 105/70, pulse 68, temperature 97.7 F (36.5 C), temperature source Oral, resp. rate 16.There is no height or weight on file to calculate BMI.  General Appearance: Casual  Eye Contact::  Fair  Speech:  Clear and Coherent  Volume:  Decreased  Mood:  Depressed and Dysphoric  Affect:  Flat  Thought Process:  Circumstantial  Orientation:  Full (Time, Place, and Person)  Thought Content:  WDL  Suicidal Thoughts:  No  Homicidal Thoughts:  No  Memory:  Immediate;   Fair Recent;   Fair Remote;   Fair  Judgement:  Fair  Insight:  Fair  Psychomotor Activity:  Normal  Concentration:  Fair  Recall:  Fair  Akathisia:  No  Handed:  Right  AIMS (if indicated):      Assets:  Communication Skills Desire for Improvement  Sleep:  Number of Hours: 6.75   Current Medications: Current Facility-Administered Medications  Medication Dose Route Frequency Provider Last Rate Last Dose  . abacavir (ZIAGEN) tablet 600 mg  600 mg Oral Daily Sanjuana Kava, NP   600 mg at 03/09/13 0844  . acetaminophen (TYLENOL) tablet 650 mg  650 mg Oral Q6H PRN Sanjuana Kava, NP      . alum & mag hydroxide-simeth (MAALOX/MYLANTA) 200-200-20 MG/5ML suspension 30 mL  30 mL Oral Q4H PRN Sanjuana Kava, NP      . amLODipine (NORVASC) tablet 10 mg  10 mg Oral Daily Sanjuana Kava, NP   10 mg at 03/09/13 0845  . atenolol (TENORMIN) tablet 50 mg  50 mg Oral Daily Sanjuana Kava, NP   50 mg at 03/09/13 0845  . Darunavir Ethanolate (PREZISTA) tablet 800 mg  800 mg Oral Q supper Sanjuana Kava, NP   800 mg at 03/08/13 1746  . lamiVUDine (EPIVIR) tablet 300 mg  300 mg Oral Daily Sanjuana Kava, NP   300 mg at 03/09/13 0843  . magnesium hydroxide (MILK OF MAGNESIA) suspension 30 mL  30 mL Oral Daily PRN Sanjuana Kava, NP      . OLANZapine (ZYPREXA) tablet 5 mg  5 mg Oral Q12H PRN Wonda Cerise, MD      . potassium chloride SA (K-DUR,KLOR-CON) CR tablet 20  mEq  20 mEq Oral Daily Sanjuana Kava, NP   20 mEq at 03/09/13 0843  . ritonavir (NORVIR) tablet 100 mg  100 mg Oral Q breakfast Sanjuana Kava, NP   100 mg at 03/09/13 0844  . sertraline (ZOLOFT) tablet 50 mg  50 mg Oral Daily Karolee Stamps, NP   50 mg at 03/09/13 0845  . traZODone (DESYREL) tablet 50 mg  50 mg Oral QHS PRN,MR X 1 Karolee Stamps, NP   50 mg at 03/08/13 2113    Lab Results:  Results for orders placed during the hospital encounter of 03/07/13 (from the past 48 hour(s))  CBC WITH DIFFERENTIAL     Status: Abnormal   Collection Time    03/07/13  3:33 PM      Result Value Range   WBC 3.7 (*) 4.0 - 10.5 K/uL   RBC 3.94 (*) 4.22 - 5.81 MIL/uL   Hemoglobin 13.0  13.0 - 17.0 g/dL   HCT 16.1 (*) 09.6 - 04.5 %   MCV 89.3  78.0 - 100.0 fL    MCH 33.0  26.0 - 34.0 pg   MCHC 36.9 (*) 30.0 - 36.0 g/dL   RDW 40.9  81.1 - 91.4 %   Platelets 135 (*) 150 - 400 K/uL   Neutrophils Relative 52  43 - 77 %   Neutro Abs 1.9  1.7 - 7.7 K/uL   Band Neutrophils 0  0 - 10 %   Lymphocytes Relative 32  12 - 46 %   Lymphs Abs 1.2  0.7 - 4.0 K/uL   Monocytes Relative 12  3 - 12 %   Monocytes Absolute 0.4  0.1 - 1.0 K/uL   Eosinophils Relative 4  0 - 5 %   Eosinophils Absolute 0.1  0.0 - 0.7 K/uL   Basophils Relative 0  0 - 1 %   Basophils Absolute 0  0.0 - 0.1 K/uL  COMPREHENSIVE METABOLIC PANEL     Status: Abnormal   Collection Time    03/07/13  3:33 PM      Result Value Range   Sodium 136  135 - 145 mEq/L   Potassium 3.6  3.5 - 5.1 mEq/L   Chloride 102  96 - 112 mEq/L   CO2 25  19 - 32 mEq/L   Glucose, Bld 102 (*) 70 - 99 mg/dL   BUN 13  6 - 23 mg/dL   Creatinine, Ser 7.82  0.50 - 1.35 mg/dL   Calcium 8.5  8.4 - 95.6 mg/dL   Total Protein 8.8 (*) 6.0 - 8.3 g/dL   Albumin 3.3 (*) 3.5 - 5.2 g/dL   AST 213 (*) 0 - 37 U/L   ALT 77 (*) 0 - 53 U/L   Alkaline Phosphatase 68  39 - 117 U/L   Total Bilirubin 0.8  0.3 - 1.2 mg/dL   GFR calc non Af Amer >90  >90 mL/min   GFR calc Af Amer >90  >90 mL/min   Comment:            The eGFR has been calculated     using the CKD EPI equation.     This calculation has not been     validated in all clinical     situations.     eGFR's persistently     <90 mL/min signify     possible Chronic Kidney Disease.  ETHANOL     Status: None   Collection Time    03/07/13  3:33 PM      Result Value Range   Alcohol, Ethyl (B) <11  0 - 11 mg/dL   Comment:            LOWEST DETECTABLE LIMIT FOR     SERUM ALCOHOL IS 11 mg/dL     FOR MEDICAL PURPOSES ONLY  ACETAMINOPHEN LEVEL     Status: None   Collection Time    03/07/13  3:33 PM      Result Value Range   Acetaminophen (Tylenol), Serum <15.0  10 - 30 ug/mL   Comment:            THERAPEUTIC CONCENTRATIONS VARY     SIGNIFICANTLY. A RANGE OF 10-30      ug/mL MAY BE AN EFFECTIVE     CONCENTRATION FOR MANY PATIENTS.     HOWEVER, SOME ARE BEST TREATED     AT CONCENTRATIONS OUTSIDE THIS     RANGE.     ACETAMINOPHEN CONCENTRATIONS     >150 ug/mL AT 4 HOURS AFTER     INGESTION AND >50 ug/mL AT 12     HOURS AFTER INGESTION ARE     OFTEN ASSOCIATED WITH TOXIC     REACTIONS.  SALICYLATE LEVEL     Status: Abnormal   Collection Time    03/07/13  3:33 PM      Result Value Range   Salicylate Lvl 2.0 (*) 2.8 - 20.0 mg/dL  URINE RAPID DRUG SCREEN (HOSP PERFORMED)     Status: Abnormal   Collection Time    03/07/13  4:46 PM      Result Value Range   Opiates NONE DETECTED  NONE DETECTED   Cocaine POSITIVE (*) NONE DETECTED   Benzodiazepines NONE DETECTED  NONE DETECTED   Amphetamines NONE DETECTED  NONE DETECTED   Tetrahydrocannabinol NONE DETECTED  NONE DETECTED   Barbiturates NONE DETECTED  NONE DETECTED   Comment:            DRUG SCREEN FOR MEDICAL PURPOSES     ONLY.  IF CONFIRMATION IS NEEDED     FOR ANY PURPOSE, NOTIFY LAB     WITHIN 5 DAYS.                LOWEST DETECTABLE LIMITS     FOR URINE DRUG SCREEN     Drug Class       Cutoff (ng/mL)     Amphetamine      1000     Barbiturate      200     Benzodiazepine   200     Tricyclics       300     Opiates          300     Cocaine          300     THC              50    Physical Findings: AIMS: Facial and Oral Movements Muscles of Facial Expression: None, normal Lips and Perioral Area: None, normal Jaw: None, normal Tongue: None, normal,Extremity Movements Upper (arms, wrists, hands, fingers): None, normal Lower (legs, knees, ankles, toes): None, normal, Trunk Movements Neck, shoulders, hips: None, normal, Overall Severity Severity of abnormal movements (highest score from questions above): None, normal Incapacitation due to abnormal movements: None, normal Patient's awareness of abnormal movements (rate only patient's report): No Awareness, Dental Status Current problems  with teeth and/or dentures?: No Does patient usually wear dentures?: No  CIWA:  CIWA-Ar Total: 3 COWS:     Treatment Plan Summary: Daily contact with patient to assess and evaluate symptoms and progress in treatment Medication management  Plan: Continue current plan of care. Patient interested in residential rehab, will be looking at a program in [place].  Medical Decision Making Problem Points:  Established problem, stable/improving (1), Review of last therapy session (1) and Review of psycho-social stressors (1) Data Points:  Review of medication regiment & side effects (2) Review of new medications or change in dosage (2)  I certify that inpatient services furnished can reasonably be expected to improve the patient's condition.   Sean Robinson 03/09/2013, 10:18 AM

## 2013-03-09 NOTE — [facility] Group Notes (Signed)
[facility] LCSW Group Therapy        Overcoming Obstacles 1:15 2:30 PM         03/09/2013 4:18 PM  Type of Therapy:  Group Therapy  Participation Level:  Active  Participation Quality:  Appropriate and Attentive  Affect:  Appropriate and Depressed  Cognitive:  Appropriate  Insight:  Engaged  Engagement in Therapy:  Engaged  Modes of Intervention:  Discussion, Education, Exploration, Problem-solving, Rapport Building and Support  Summary of Progress/Problems:  Patient advised he is his own obstacle.  He shared he cannot drink alcohol because it always leads to him using other drugs.  He shared once he starts to use he will not stop until all of his money is gone.  [name] 03/09/2013, 4:18 PM

## 2013-03-09 NOTE — Progress Notes (Signed)
D: Patient denies SI/HI/AVH. Patient rates hopelessness as 5,  depression as 5, and anxiety as 5.  Patient affect is anxious. Mood is depressed.  Pt states "I am supposed to find out about inpatient treatment tomorrow and I excited about that.  I hope they find some place for me to go."  Patient did attend evening group. Patient visible on the milieu. No distress noted. A: Support and encouragement offered. Scheduled medications given to pt. Q 15 min checks continued for patient safety. R: Patient receptive. Patient remains safe on the unit.

## 2013-03-09 NOTE — Progress Notes (Signed)
[facility] Group Notes:  (Nursing/MHT/Case Management/Adjunct)  Date:  03/08/2013 Time:  2000  Type of Therapy:  Psychoeducational Skills  Participation Level:  Minimal  Participation Quality:  Attentive  Affect:  Blunted  Cognitive:  Appropriate  Insight:  Improving  Engagement in Group:  Lacking  Modes of Intervention:  Education  Summary of Progress/Problems: The patient shared with the group that he had a better day today. He stated that he was "grateful" to be here. His goal for tomorrow is to learn more coping skills.     Hazle Coca S 03/09/2013, 12:42 AM

## 2013-03-10 MED ORDER — TRAZODONE HCL 100 MG PO TABS
100.0000 mg | ORAL_TABLET | Freq: Every evening | ORAL | Status: DC | PRN
  Administered 2013-03-10 – 2013-03-11 (×2): 100 mg via ORAL
  Filled 2013-03-10 (×2): qty 1
  Filled 2013-03-10: qty 2

## 2013-03-10 MED ORDER — TUBERCULIN PPD 5 UNIT/0.1ML ID SOLN: 5.0000 [IU] | Freq: Once | INTRADERMAL | Status: DC

## 2013-03-10 MED ORDER — NONFORMULARY OR COMPOUNDED ITEM
1.0000 "application " | Freq: Once | Status: AC
  Administered 2013-03-12: 1 via TOPICAL
  Filled 2013-03-10: qty 1

## 2013-03-10 NOTE — Progress Notes (Signed)
[facility] MD Progress Note  03/10/2013 11:29 AM Sean Robinson  MRN:  [id number] Subjective:  Sean Robinson reports that his mood is stabilizing. Today he rates his depression at five and also anxiety. Patient thinks that he is feeling better because "I am in a safe place." He is also optimistic about going to a rehab [place] named [facility] [place]. The patient denies having needing help with any problems today stating "I am doing well."  Diagnosis:   Axis I: Depressive Disorder NOS Axis II: Deferred Axis III:  Past Medical History  Diagnosis Date  . HIV (human immunodeficiency virus infection)   . Hypertension   . Mental disorder   . Depression   . H/O psychiatric hospitalization     [facility], [place] - 2007 and [facility] - 2008  . Polysubstance abuse     ETOH and crack   Axis IV: housing problems and other psychosocial or environmental problems Axis V: 51-60 moderate symptoms  ADL's:  Intact  Sleep: Poor  Appetite:  Fair  Suicidal Ideation:  Denies Homicidal Ideation:  Denies AEB (as evidenced by):  Psychiatric Specialty Exam: Review of Systems  Constitutional: Negative.   HENT: Positive for congestion (Patient thinks from heater in room. ).   Eyes: Negative.   Respiratory: Negative.   Cardiovascular: Negative.   Gastrointestinal: Negative.   Genitourinary: Negative.   Musculoskeletal: Negative.   Skin: Negative.   Neurological: Negative.   Endo/Heme/Allergies: Negative.   Psychiatric/Behavioral: Positive for depression and substance abuse. Negative for suicidal ideas, hallucinations and memory loss. The patient is nervous/anxious and has insomnia.     Blood pressure 118/70, pulse 85, temperature 97.8 F (36.6 C), temperature source Oral, resp. rate 18.There is no height or weight on file to calculate BMI.  General Appearance: Casual  Eye Contact::  Fair  Speech:  Clear and Coherent  Volume:  Normal  Mood:  Depressed  Affect:  Flat  Thought Process:   Goal Directed  Orientation:  Full (Time, Place, and Person)  Thought Content:  WDL  Suicidal Thoughts:  No  Homicidal Thoughts:  No  Memory:  Immediate;   Good Recent;   Good Remote;   Good  Judgement:  Fair  Insight:  Fair  Psychomotor Activity:  Normal  Concentration:  Good  Recall:  Good  Akathisia:  No  Handed:  Right  AIMS (if indicated):     Assets:  Communication Skills Desire for Improvement Leisure Time Resilience  Sleep:  Number of Hours: 6.75   Current Medications: Current Facility-Administered Medications  Medication Dose Route Frequency Provider Last Rate Last Dose  . abacavir (ZIAGEN) tablet 600 mg  600 mg Oral Daily Sanjuana Kava, NP   600 mg at 03/10/13 4098  . acetaminophen (TYLENOL) tablet 650 mg  650 mg Oral Q6H PRN Sanjuana Kava, NP      . alum & mag hydroxide-simeth (MAALOX/MYLANTA) 200-200-20 MG/5ML suspension 30 mL  30 mL Oral Q4H PRN Sanjuana Kava, NP      . amLODipine (NORVASC) tablet 10 mg  10 mg Oral Daily Sanjuana Kava, NP   10 mg at 03/10/13 0811  . atenolol (TENORMIN) tablet 50 mg  50 mg Oral Daily Sanjuana Kava, NP   50 mg at 03/10/13 1191  . Darunavir Ethanolate (PREZISTA) tablet 800 mg  800 mg Oral Q supper Sanjuana Kava, NP   800 mg at 03/09/13 1726  . feeding supplement (ENSURE COMPLETE) liquid 237 mL  237  mL Oral BID BM Jeoffrey Massed, RD   237 mL at 03/10/13 0942  . lamiVUDine (EPIVIR) tablet 300 mg  300 mg Oral Daily Sanjuana Kava, NP   300 mg at 03/10/13 0811  . magnesium hydroxide (MILK OF MAGNESIA) suspension 30 mL  30 mL Oral Daily PRN Sanjuana Kava, NP      . OLANZapine (ZYPREXA) tablet 5 mg  5 mg Oral Q12H PRN Wonda Cerise, MD      . potassium chloride SA (K-DUR,KLOR-CON) CR tablet 20 mEq  20 mEq Oral Daily Sanjuana Kava, NP   20 mEq at 03/10/13 0813  . ritonavir (NORVIR) tablet 100 mg  100 mg Oral Q breakfast Sanjuana Kava, NP   100 mg at 03/10/13 1610  . sertraline (ZOLOFT) tablet 50 mg  50 mg Oral Daily Karolee Stamps, NP   50 mg at  03/10/13 0814  . traZODone (DESYREL) tablet 50 mg  50 mg Oral QHS PRN,MR X 1 Karolee Stamps, NP   50 mg at 03/09/13 2159    Lab Results: No results found for this or any previous visit (from the past 48 hour(s)).  Physical Findings: AIMS: Facial and Oral Movements Muscles of Facial Expression: None, normal Lips and Perioral Area: None, normal Jaw: None, normal Tongue: None, normal,Extremity Movements Upper (arms, wrists, hands, fingers): None, normal Lower (legs, knees, ankles, toes): None, normal, Trunk Movements Neck, shoulders, hips: None, normal, Overall Severity Severity of abnormal movements (highest score from questions above): None, normal Incapacitation due to abnormal movements: None, normal Patient's awareness of abnormal movements (rate only patient's report): No Awareness, Dental Status Current problems with teeth and/or dentures?: No Does patient usually wear dentures?: No  CIWA:  CIWA-Ar Total: 1 COWS:  COWS Total Score: 1  Treatment Plan Summary: Daily contact with patient to assess and evaluate symptoms and progress in treatment Medication management  Plan: Continue crisis management and stabilization.  Medication management: Currently receiving Zoloft 50 mg daily for depression. Trazodone increased to  100 mg to help with sleep as patient reports poor quality.  Encouraged patient to attend groups and participate in group counseling sessions and activities.  Discharge plan in progress.  Address health issues: Vitals reviewed and stable. Patient receiving medications for HTN and HIV.  Continue current treatment plan.   Medical Decision Making Problem Points:  Established problem, stable/improving (1) and Review of psycho-social stressors (1) Data Points:  Review of medication regiment & side effects (2)  I certify that inpatient services furnished can reasonably be expected to improve the patient's condition.   Fransisca Kaufmann ANN NP-C 03/10/2013, 11:29 AM

## 2013-03-10 NOTE — Progress Notes (Signed)
Grief and Loss Group  Held group on grief and loss. Patients discussed loss of loved ones to death and the loss of self due to many different circumstances. Patients also discussed coping skills for loss and grief.  Pt was present in group and listened to group members as they shared.  Sean Robinson Counseling Intern [employer]

## 2013-03-10 NOTE — [facility] Group Notes (Signed)
[facility] LCSW Aftercare Discharge Planning Group Note   03/10/2013 11:38 AM  Participation Quality:  Appropriate  Mood/Affect:  Appropriate  Depression Rating:  4  Anxiety Rating:  4  Thoughts of Suicide:  No  Will you contract for safety?   NA  Current AVH:  No  Plan for Discharge/Comments:   Patient reports being better today.  He advised he wants to go to [facility] and hopes to remain inpatient until a bed is available.    Transportation Means: Public transportation   Supports:  Limited support from family.  Sean Robinson, Sean Robinson

## 2013-03-10 NOTE — Progress Notes (Signed)
PPD NOT GIVEN.  PATIENT STATED HE HAD POSITIVE PPD IN THE EARLY 2000'S IN [place], [place] AT THE HEALTH DEPT.   HAD CHEST XRAY, WAS GIVEN 6 MONTHS OF YELLOW PILLS.  DISCUSSED WITH NP WHO STATED TO PUT HIS NOTE IN CHART FOR REVIEW.

## 2013-03-10 NOTE — [facility] Counselor (Signed)
Adult Comprehensive Assessment  Patient ID: Sean Robinson, male   DOB: Sep 19, 1961, 52 y.o.   MRN: [id number]  Information Source: Information source: Patient  Current Stressors:  Educational / Learning stressors: None Employment / Job issues: Patient is on disability Family Relationships: Distant relationship with family Surveyor, quantity / Lack of resources (include bankruptcy): None Housing / Lack of housing: Homeless Physical health (include injuries & life threatening diseases): HIV   HTN Social relationships: None Substance abuse: Crack cocaien abuse Bereavement / Loss: None  Living/Environment/Situation:  Living Arrangements: Other relatives Living conditions (as described by patient or guardian): Patient was living with family prior to admission but cannot return to the home How long has patient lived in current situation?: seven months What is atmosphere in current home: Temporary  Family History:  Marital status: Single Does patient have children?: No  Childhood History:  By whom was/is the patient raised?: Both parents Additional childhood history information: Good childhood Description of patient's relationship with caregiver when they were a child: Good Patient's description of current relationship with people who raised him/her: Both parents are deceased Does patient have siblings?: Yes Number of Siblings: 4 Description of patient's current relationship with siblings: Distant relationships Did patient suffer any verbal/emotional/physical/sexual abuse as a child?: No Did patient suffer from severe childhood neglect?: No Has patient ever been sexually abused/assaulted/raped as an adolescent or adult?: No Was the patient ever a victim of a crime or a disaster?: No Witnessed domestic violence?: No Has patient been effected by domestic violence as an adult?: No  Education:  Highest grade of school patient has completed: GED Currently a Consulting civil engineer?: No Learning disability?:  No  Employment/Work Situation:   Employment situation: On disability Why is patient on disability: Patient on disability due to Mental Health and HIV How long has patient been on disability: 55 Patient's job has been impacted by current illness: No What is the longest time patient has a held a job?: one year 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 Has patient ever been in the [employer]?: No Has patient ever served in Buyer, retail?: No  Financial Resources:   Surveyor, quantity resources: Writer  Alcohol/Substance Abuse:   What has been your use of drugs/alcohol within the last 12 months?: Patient reports abusing crack cocaine  He advised he uses all of his spends his monthly check on crack If attempted suicide, did drugs/alcohol play a role in this?: No Alcohol/Substance Abuse Treatment Hx: Past Tx, Inpatient If yes, describe treatment: Patient reports having gone to a detox program in [place] several years ago Has alcohol/substance abuse ever caused legal problems?: No  Social Support System:   Forensic psychologist System: None Type of faith/religion: [employer] How does patient's faith help to cope with current illness?: Chief Operating Officer:   Leisure and Hobbies: TV  Strengths/Needs:   What things does the patient do well?: Helping others In what areas does patient struggle / problems for patient: Addiction  Discharge Plan:   Does patient have access to transportation?: Yes Will patient be returning to same living situation after discharge?: No Plan for living situation after discharge: Referral made for residential treatment Currently receiving community mental health services: No If no, would patient like referral for services when discharged?: Yes (What county?) (Referral made to [facility] in [place], [place]) Does patient have financial barriers related to discharge medications?: No  Summary/Recommendations:  Sean Robinson is 53 year old African American  male admitted with Major Depression Disorder.  He will Patient will  benefit from crisis stabilization, evaluation for medication management, psycho education groups for coping skills development, group therapy and assistance with discharge planning.     Sean Robinson, Sean Robinson July. 03/10/2013

## 2013-03-10 NOTE — Progress Notes (Signed)
D:  Patient's self inventory sheet, patient has poor sleep, improving appetite, low energy level, poor attention span.  Rated depression #9, hopelessness and anxiety #4.  Denied withdrawals.  Denied physical problems.  After discharge, plans to stay sober.  Denied HI.  Denied A/V hallucinations.  Denied pain.  No discharge plans.  Insurance will buy his meds after discharge. A:  Medications administered per MD orders.  Support and encouragement given throughout day. R:  Following treatment plan.  Denied SI and HI.  Denied A/V hallucinations.  Contracts for safety.

## 2013-03-10 NOTE — [facility] Group Notes (Signed)
[facility] LCSW Group Therapy      Feelings About Diagnosis 1:15 - 2:30 PM          03/10/2013 3:52 PM  Type of Therapy:  Group Therapy  Participation Level:  Active  Participation Quality:  Appropriate  Affect:  Blunted  Cognitive:  Appropriate  Insight:  Engaged  Engagement in Therapy:  Engaged  Modes of Intervention:  Discussion, Education, Exploration, Problem-solving, Rapport Building and Support  Summary of Progress/Problems:  Patient shared he does not focus on what he considered to be "just a diagnosis."  He shared he is a good and caring person who likes to help others.  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  [name] 03/10/2013, 3:52 PM

## 2013-03-10 NOTE — Care Management Utilization Note (Signed)
   Per State Regulation 482.30  This chart was reviewed for necessity with respect to the patient's Admission/ Duration of stay.  Next review date: 03/13/13  Harmoni Lucus Morrison RN, BSN 

## 2013-03-11 ENCOUNTER — Ambulatory Visit (HOSPITAL_COMMUNITY)
Admission: EM | Admit: 2013-03-11 | Discharge: 2013-03-11 | Disposition: A | Discharge status: Home / Self Care | Payer: Medicare Other | Source: Intra-hospital | Attending: Psychiatry | Admitting: Psychiatry

## 2013-03-11 DIAGNOSIS — F3289 Other specified depressive episodes: Principal | ICD-10-CM

## 2013-03-11 DIAGNOSIS — F329 Major depressive disorder, single episode, unspecified: Principal | ICD-10-CM

## 2013-03-11 MED ORDER — LORATADINE 10 MG PO TABS
10.0000 mg | ORAL_TABLET | Freq: Every day | ORAL | Status: DC
  Administered 2013-03-11 – 2013-03-12 (×2): 10 mg via ORAL
  Filled 2013-03-11 (×5): qty 1

## 2013-03-11 NOTE — Progress Notes (Signed)
Recreation Therapy Notes  Date: 04.09.2014 Time: 3:00pm Location: [address] Dayroom      Group Topic/Focus: Decision Making, Problem Solving, Communciation  Participation Level: Active  Participation Quality: Appropriate  Affect: Euthymic to Bright at times  Cognitive: Appropriate   Additional Comments: Activity: Lost at [employer]; Explanation: LRT reads aloud a scenario. Patients are grouped into groups of 3, patients have to prioritize items given through scenario to ensure patient survival.   Patient listened to scenario. Patient was asked to leave session by LCSW at approximately 3:10pm. Patient did not return to group session.   Marykay Lex Denym Christenberry, LRT/CTRS  Louvina Cleary L 03/11/2013 5:02 PM

## 2013-03-11 NOTE — Progress Notes (Signed)
[facility] MD Progress Note  03/11/2013 2:34 PM Sean Robinson  MRN:  [id number] Subjective:  Sean Robinson is observed resting in bed this afternoon. He rates his depression at five and denies any SI. Patient complains of feeling nasal congestion that is affecting his ability to breathe comfortably. He does not feel like eating today. He reports that the congestion affected his quality of sleep last night. Patient is still optimistic about going [place] a treatment [place] of state. 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  Diagnosis:   Axis I: Depressive Disorder NOS Axis II: Deferred Axis III:  Past Medical History  Diagnosis Date  . HIV (human immunodeficiency virus infection)   . Hypertension   . Mental disorder   . Depression   . H/O psychiatric hospitalization     [facility], [place] - 2007 and [facility] - 2008  . Polysubstance abuse     ETOH and crack   Axis IV: housing problems and other psychosocial or environmental problems Axis V: 51-60 moderate symptoms  ADL's:  Intact  Sleep: Poor  Appetite:  Poor  Suicidal Ideation:  Denies Homicidal Ideation:  Denies AEB (as evidenced by):  Psychiatric Specialty Exam: Review of Systems  Constitutional: Negative.   HENT: Positive for congestion.   Eyes: Negative.   Respiratory: Negative.   Cardiovascular: Negative.   Gastrointestinal: Negative.   Genitourinary: Negative.   Musculoskeletal: Negative.   Skin: Negative.   Neurological: Negative.   Endo/Heme/Allergies: Negative.   Psychiatric/Behavioral: Positive for depression and substance abuse. Negative for suicidal ideas, hallucinations and memory loss. The patient has insomnia. The patient is not nervous/anxious.     Blood pressure 101/66, pulse 69, temperature 97.5 F (36.4 C), temperature source  Oral, resp. rate 16.There is no height or weight on file to calculate BMI.  General Appearance: Disheveled  Eye Contact::  Poor  Speech:  Clear and Coherent  Volume:  Decreased  Mood:  Depressed  Affect:  Blunt and Flat  Thought Process:  Coherent and Goal Directed  Orientation:  Full (Time, Place, and Person)  Thought Content:  WDL  Suicidal Thoughts:  No  Homicidal Thoughts:  No  Memory:  Immediate;   Good Recent;   Good Remote;   Good  Judgement:  Fair  Insight:  Fair  Psychomotor Activity:  Normal  Concentration:  Good  Recall:  Good  Akathisia:  No  Handed:  Right  AIMS (if indicated):     Assets:  Communication Skills Desire for Improvement Leisure Time Resilience Social Support  Sleep:  Number of Hours: 6.75   Current Medications: Current Facility-Administered Medications  Medication Dose Route Frequency Provider Last Rate Last Dose  . abacavir (ZIAGEN) tablet 600 mg  600 mg Oral Daily Sanjuana Kava, NP   600 mg at 03/11/13 1478  . acetaminophen (TYLENOL) tablet 650 mg  650 mg Oral Q6H PRN Sanjuana Kava, NP      . alum & mag hydroxide-simeth (MAALOX/MYLANTA) 200-200-20 MG/5ML suspension 30 mL  30 mL Oral Q4H PRN Sanjuana Kava, NP      . amLODipine (NORVASC) tablet 10 mg  10 mg Oral Daily Sanjuana Kava, NP   10 mg at 03/11/13 0824  . atenolol (TENORMIN) tablet 50 mg  50 mg Oral Daily Sanjuana Kava,  NP   50 mg at 03/11/13 0824  . Darunavir Ethanolate (PREZISTA) tablet 800 mg  800 mg Oral Q supper Sanjuana Kava, NP   800 mg at 03/10/13 1738  . feeding supplement (ENSURE COMPLETE) liquid 237 mL  237 mL Oral BID BM Jeoffrey Massed, RD   237 mL at 03/11/13 1251  . lamiVUDine (EPIVIR) tablet 300 mg  300 mg Oral Daily Sanjuana Kava, NP   300 mg at 03/11/13 1191  . magnesium hydroxide (MILK OF MAGNESIA) suspension 30 mL  30 mL Oral Daily PRN Sanjuana Kava, NP      . Melene Muller ON 03/12/2013] NONFORMULARY OR COMPOUNDED ITEM 1 application  1 application Topical Once Himabindu Ravi, MD       . OLANZapine (ZYPREXA) tablet 5 mg  5 mg Oral Q12H PRN Wonda Cerise, MD      . potassium chloride SA (K-DUR,KLOR-CON) CR tablet 20 mEq  20 mEq Oral Daily Sanjuana Kava, NP   20 mEq at 03/11/13 0823  . ritonavir (NORVIR) tablet 100 mg  100 mg Oral Q breakfast Sanjuana Kava, NP   100 mg at 03/11/13 4782  . sertraline (ZOLOFT) tablet 50 mg  50 mg Oral Daily Karolee Stamps, NP   50 mg at 03/11/13 9562  . traZODone (DESYREL) tablet 100 mg  100 mg Oral QHS PRN,MR X 1 Karolee Stamps, NP   100 mg at 03/10/13 2116  . tuberculin injection 5 Units  5 Units Intradermal Once Karolee Stamps, NP        Lab Results: No results found for this or any previous visit (from the past 48 hour(s)).  Physical Findings: AIMS: Facial and Oral Movements Muscles of Facial Expression: None, normal Lips and Perioral Area: None, normal Jaw: None, normal Tongue: None, normal,Extremity Movements Upper (arms, wrists, hands, fingers): None, normal Lower (legs, knees, ankles, toes): None, normal, Trunk Movements Neck, shoulders, hips: None, normal, Overall Severity Severity of abnormal movements (highest score from questions above): None, normal Incapacitation due to abnormal movements: None, normal Patient's awareness of abnormal movements (rate only patient's report): No Awareness, Dental Status Current problems with teeth and/or dentures?: No Does patient usually wear dentures?: No  CIWA:  CIWA-Ar Total: 1 COWS:  COWS Total Score: 1  Treatment Plan Summary: Daily contact with patient to assess and evaluate symptoms and progress in treatment Medication management  Plan: Continue crisis management and stabilization.  Medication management: Continue medication regimen.  Encouraged patient to attend groups and participate in group counseling sessions and activities.  Discharge plan in progress. Chest x-ray to be completed today per facility requirements.  Address health issues: Order claritin 10 mg po daily for  symptoms of allergic rhinitis. Vitals reviewed and stable. Continue current treatment plan.   Medical Decision Making Problem Points:  Established problem, stable/improving (1) and Review of psycho-social stressors (1) Data Points:  Review of medication regiment & side effects (2)  I certify that inpatient services furnished can reasonably be expected to improve the patient's condition.   Fransisca Kaufmann ANN 03/11/2013, 2:34 PM

## 2013-03-11 NOTE — Progress Notes (Signed)
Patient in day room at the beginning of the shift interacting with peers. He endorsed a great day, denied SI/HI and denied hallucinations. His mood and affect appropriate. Denied any complaints during this assessment. Writer encouraged and supported patient. Patient receptive to supported dn encouragement. Received PRN Trazodone at bedtime. Q 15 minute check continues as ordered to maintain safety.

## 2013-03-11 NOTE — Progress Notes (Signed)
(  D) In bed most of AM. Declined to go down to cafeteria with peers. Tray was brought back to patient and Ensure provided. Patient declined to complete daily self inventory.  Hygiene poor. (A) Encouraged patient to attend groups and to interact in milieu. Patient also educated on importance of going to cafeteria for meals. (R) Patient quiet and guarded.

## 2013-03-11 NOTE — Progress Notes (Signed)
Adult Psychoeducational Group Note  Date:  03/11/2013 Time: 8:15PM Group Topic/Focus:  Wrap-Up Group:   The focus of this group is to help patients review their daily goal of treatment and discuss progress on daily workbooks.  Participation Level:  Active  Participation Quality:  Appropriate and Attentive  Affect:  Appropriate  Cognitive:  Alert and Appropriate  Insight: Appropriate  Engagement in Group:  Engaged  Modes of Intervention:  Discussion  Additional Comments:  Pt. Was attentive and appropriate during tonight's group discussion. Pt. Stated that he is going to a treatment center tomorrow and he's excited for the change. Pt stated that he is willing and committed this time around. Pt stated that he has learned a lot of coping skills and will apply them once discharged.   Bing Plume D 03/11/2013, 9:28 PM

## 2013-03-11 NOTE — BHH Group Notes (Signed)
[facility] LCSW Group Therapy      Emotional Regulation 1:15 - 2:30 PM          03/11/2013 3:31 PM  Type of Therapy:  Group Therapy  Participation Level:  Did Not Attend   [name] 03/11/2013, 3:31 PM

## 2013-03-11 NOTE — Progress Notes (Signed)
D: Patient denies SI/HI/AVH. Patient rates hopelessness as 4,  depression as 4, and anxiety as 4.  Patient affect is flat. Mood is depressed.  Pt states "I think they may have found me a long term rehab to go to.  My health problems are my motivation to get on track."  Patient did attend evening group. Patient visible on the milieu. No distress noted. A: Support and encouragement offered. Scheduled medications given to pt. Q 15 min checks continued for patient safety. R: Patient receptive. Patient remains safe on the unit.

## 2013-03-12 DIAGNOSIS — F1410 Cocaine abuse, uncomplicated: Secondary | ICD-10-CM

## 2013-03-12 MED ORDER — SERTRALINE HCL 50 MG PO TABS: 50.0000 mg | ORAL_TABLET | Freq: Every day | ORAL | Status: AC

## 2013-03-12 MED ORDER — ABACAVIR SULFATE 300 MG PO TABS: ORAL_TABLET | ORAL | Status: DC

## 2013-03-12 MED ORDER — LAMIVUDINE 300 MG PO TABS: 300.0000 mg | ORAL_TABLET | Freq: Every day | ORAL | Status: DC

## 2013-03-12 MED ORDER — TRAZODONE HCL 100 MG PO TABS: 100.0000 mg | ORAL_TABLET | Freq: Every evening | ORAL | Status: AC | PRN

## 2013-03-12 NOTE — Progress Notes (Signed)
[facility] Adult Case Management Discharge Plan :  Will you be returning to the same living situation after discharge: No. At discharge, do you have transportation home?:Yes,  Patient will travel by bus to [place] Do you have the ability to pay for your medications:Yes,  Patient is able to obtain medications.  Release of information consent forms completed and in the chart;  Patient's signature needed at discharge.  Patient to Follow up at: Follow-up Information   Follow up with [facility]  On 03/13/2013. (You are scheduled to admit to [facility] on Friday, March 13, 2013)    Contact information:   [address] [place], [place]  [address]  [phone] or [phone]      Patient denies SI/HI:   Yes,  Patient is not endorsing SI/HI or thoughts of self harm.    Safety Planning and Suicide Prevention discussed:  Yes,  Reviewed during aftecare group.  Sean Robinson 03/12/2013, 10:04 AM

## 2013-03-12 NOTE — BHH Group Notes (Signed)
[facility] LCSW Aftercare Discharge Planning Group Note   03/12/2013 9:58 AM  Participation Quality:  Appropriate  Mood/Affect:  Appropriate  Depression Rating:  1  Anxiety Rating:  1  Thoughts of Suicide:  No  Will you contract for safety?   NA  Current AVH:  No  Plan for Discharge/Comments:  Patient reports being packed and ready to go to [facility] in [place] this evening.  He advised he is going to have a new great/great niece or nephew soon and he wants to be able to have a place in the child's life.  Transportation Means:   Public transportation  Supports:  Limited.  Johnye Kist, [name] July

## 2013-03-12 NOTE — BHH Group Notes (Signed)
[facility] LCSW Group Therapy  03/12/2013 12:48 PM           [facility] 1:15 - 2:30 PM   Type of Therapy:  Group Therapy  Participation Level:  Active  Participation Quality:  Appropriate  Affect:  Appropriate  Cognitive:  Alert and Appropriate  Insight:  Engaged  Engagement in Therapy:  Engaged  Modes of Intervention:  Discussion, Education, Exploration, Problem-solving, Rapport Building and Support  Summary of Progress/Problems:  Patient listened attentively to speaker from [facility].  He thanked speaker for sharing her story with the group.  [name] 03/12/2013, 12:48 PM

## 2013-03-12 NOTE — BHH Suicide Risk Assessment (Signed)
Suicide Risk Assessment  Discharge Assessment     Demographic Factors:  Male, Low socioeconomic status and Unemployed, [place]  Mental Status Per Nursing Assessment::   On Admission:  NA  Current Mental Status by Physician: Patient alert and oriented to 4. Denies aH/VH/SI/HI  Loss Factors: Decrease in vocational status and Financial problems/change in socioeconomic status  Historical Factors: Family history of mental illness or substance abuse  Risk Reduction Factors:   Positive coping skills or problem solving skills  Continued Clinical Symptoms:  Alcohol/Substance Abuse/Dependencies  Cognitive Features That Contribute To Risk:  Cognitively intact   Suicide Risk:  Minimal: No identifiable suicidal ideation.  Patients presenting with no risk factors but with morbid ruminations; may be classified as minimal risk based on the severity of the depressive symptoms  Discharge Diagnoses:   AXIS I:  Alcohol Abuse and Depressive Disorder NOS AXIS II:  Deferred AXIS III:   Past Medical History  Diagnosis Date  . HIV (human immunodeficiency virus infection)   . Hypertension   . Mental disorder   . Depression   . H/O psychiatric hospitalization     [facility], [place] - 2007 and [facility] - 2008  . Polysubstance abuse     ETOH and crack   AXIS IV:  housing problems, occupational problems and other psychosocial or environmental problems AXIS V:  61-70 mild symptoms  Plan Of Care/Follow-up recommendations:  Activity:  as tolerated Diet:  regular Follow up with outpatient appointments.  Is patient on multiple antipsychotic therapies at discharge:  No   Has Patient had three or more failed trials of antipsychotic monotherapy by history:  No  Recommended Plan for Multiple Antipsychotic Therapies: NA  Sean Robinson 03/12/2013, 11:00 AM

## 2013-03-12 NOTE — Progress Notes (Signed)
Adult Psychoeducational Group Note  Date:  03/12/2013 Time:  10:00 AM  Group Topic/Focus:  Coping With Mental Health Crisis:   The purpose of this group is to help patients identify strategies for coping with mental health crisis.  Group discusses possible causes of crisis and ways to manage them effectively.  Participation Level:  Active  Participation Quality:  Appropriate and Attentive  Affect:  Appropriate  Cognitive:  Alert and Oriented  Insight: Appropriate  Engagement in Group:  Engaged  Modes of Intervention:  Activity, Discussion and Education  Additional Comments:  Patient stated, "I like all music, except for rap".  Harold Barban E 03/12/2013, 11:06 AM

## 2013-03-12 NOTE — Progress Notes (Signed)
Adult Psychoeducational Group Note  Date:  03/12/2013 Time:  6:56 PM  Group Topic/Focus:  Overcoming Stress:   The focus of this group is to define stress and help patients assess their triggers.  Participation Level:  Active  Participation Quality:  Appropriate, Attentive and Sharing  Affect:  Appropriate  Cognitive:  Appropriate  Insight: Appropriate  Engagement in Group:  Engaged  Modes of Intervention:  Activity, Discussion, Education, Socialization and Support  Additional Comments:  [place] attended group and participated. Patient shared personal term of stress. Patient stated how stress can be positive and negative. Patient stated completed stress interview with a peer within the group. Patient stated his family and having a job stresses him out the most. Patient expressed ways to manage stress.    Karleen Hampshire Brittini 03/12/2013, 6:56 PM

## 2013-03-12 NOTE — Discharge Summary (Signed)
Physician Discharge Summary Note  Patient:  Sean Robinson is an 52 y.o., male MRN:  [id number] DOB:  23-Oct-1961 Patient phone:  [phone] (home)  Patient address:   [address] [place] [address],   Date of Admission:  03/08/2013 Date of Discharge: 03/12/13  Reason for Admission: Depression and Substance Abuse  Discharge Diagnoses: Active Problems:   * No active hospital problems. *  Review of Systems  Constitutional: Negative.   HENT: Negative.   Eyes: Negative.   Respiratory: Negative.   Cardiovascular: Negative.   Gastrointestinal: Negative.   Genitourinary: Negative.   Musculoskeletal: Negative.   Skin: Negative.   Neurological: Negative.   Endo/Heme/Allergies: Negative.   Psychiatric/Behavioral: Positive for depression and substance abuse. Negative for suicidal ideas, hallucinations and memory loss. The patient is not nervous/anxious and does not have insomnia.    Axis Diagnosis:   AXIS I:  Depressive Disorder NOS and Cocaine Abuse AXIS II:  Deferred AXIS III:   Past Medical History  Diagnosis Date  . HIV (human immunodeficiency virus infection)   . Hypertension   . Mental disorder   . Depression   . H/O psychiatric hospitalization     [facility], [place] - 2007 and [facility] - 2008  . Polysubstance abuse     ETOH and crack   AXIS IV:  housing problems and other psychosocial or environmental problems AXIS V:  61-70 mild symptoms  Level of Care:  RTC  Hospital Course:  Sean Robinson is an 52 y.o. male that presented to [facility] voluntarily with SI and a plan to "to jump in front of a car". Pt said "I'm not safe out there, I need to be inpatient, I don't feel safe". Pt denies HI, AVH, or psychosis. Pt is oriented x's 4, alert, calm and cooperative and tearful during assessment. Pt denies sexual, physical or emotional abuse. Pt confirms a sa hx with an etoh onset at 52 yo and a crack cocaine onset at 52 yo; with last use of both on 03/05/13. Pt confirms that "I  smoke five cigarettes a day". Pt reports that his recent memory is intact and his remote memory is not intact. Pt confirms depressive symptoms of hopeless, fatigue, guilt, tearful, loss of interest in usual pleasure, feeling irritable, feeling worthless and self-pity and insomnia (5-6/24). Pt denies any family hx of suicide or mh problems. Pt confirms no pain (0/10) and he is able to complete all ADL's without assistance. Pt reports that his support system is nonexistent and when asked if his family support him he said "no, none of them". Pt reports recent stress and conflict was telling his family his HIV status.   The duration of stay was four days. The patient was seen and evaluated by the Treatment team consisting of Psychiatrist, NP-C, RN, Case Manager, and Therapist for evaluation and treatment plan with goal of stabilization upon discharge. The patient's physical and mental health problems were identified and treated appropriately.      Multiple modalities of treatment were used including medication, individual and group therapies, unit programming, improved nutrition, physical activity, and family sessions as needed. The patient was started on Zoloft 50 mg to help with his depressive symptoms. Trazodone 100 mg at hs was initiated to help improve patient's quality of sleep. His medications for high blood pressure and HIV were continued during his stay. B[place]lin tolerated his medication regimen without any reported side effects.      The symptoms of Depression were monitored daily by evaluation  by clinical provider.  The patient's mental and emotional status was evaluated by a daily self inventory completed by the patient.      Improvement was demonstrated by declining numbers on the self assessment, improving vital signs, increased cognition, and improvement in mood, sleep, appetite as well as a reduction in physical symptoms.       The patient was evaluated and found to be stable enough for discharge and  was released to take the bus to treatment center in [place] per his discharge plan. Sean Robinson denied any thoughts of SI or HI prior to his discharge.   Mental Status Exam:  For mental status exam please see mental status exam and  suicide risk assessment completed by attending physician prior to discharge.  Consults:  None  Significant Diagnostic Studies:  labs: Chem profile, CBC, UDS positive for cocaine radiology: X-Ray: Negative for active TB  Discharge Vitals:   Blood pressure 106/71, pulse 60, temperature 97.2 F (36.2 C), temperature source Oral, resp. rate 18. There is no height or weight on file to calculate BMI. Lab Results:   No results found for this or any previous visit (from the past 72 hour(s)).  Physical Findings: AIMS: Facial and Oral Movements Muscles of Facial Expression: None, normal Lips and Perioral Area: None, normal Jaw: None, normal Tongue: None, normal,Extremity Movements Upper (arms, wrists, hands, fingers): None, normal Lower (legs, knees, ankles, toes): None, normal, Trunk Movements Neck, shoulders, hips: None, normal, Overall Severity Severity of abnormal movements (highest score from questions above): None, normal Incapacitation due to abnormal movements: None, normal Patient's awareness of abnormal movements (rate only patient's report): No Awareness, Dental Status Current problems with teeth and/or dentures?: No Does patient usually wear dentures?: No  CIWA:  CIWA-Ar Total: 1 COWS:  COWS Total Score: 1  Psychiatric Specialty Exam: See Psychiatric Specialty Exam and Suicide Risk Assessment completed by Attending Physician prior to discharge.  Discharge destination:  Other:  [facility] in [place]  Is patient on multiple antipsychotic therapies at discharge:  No   Has Patient had three or more failed trials of antipsychotic monotherapy by history:  No  Recommended Plan for Multiple Antipsychotic Therapies: N/A  Discharge Orders   Future  Orders Complete By Expires     Activity as tolerated - No restrictions  As directed     Diet general  As directed         Medication List    STOP taking these medications       HYDROcodone-acetaminophen 5-325 MG per tablet  Commonly known as:  NORCO/VICODIN      TAKE these medications     Indication   amLODipine 10 MG tablet  Commonly known as:  NORVASC  Take 10 mg by mouth daily.      atenolol 50 MG tablet  Commonly known as:  TENORMIN  Take 50 mg by mouth daily.      EPZICOM 600-300 MG per tablet  Generic drug:  abacavir-lamiVUDine  Take 1 tablet by mouth daily.      potassium chloride SA 20 MEQ tablet  Commonly known as:  K-DUR,KLOR-CON  Take 20 mEq by mouth daily.      PREZISTA 800 MG tablet  Generic drug:  Darunavir Ethanolate  Take 800 mg by mouth daily.      ritonavir 100 MG capsule  Commonly known as:  NORVIR  Take 100 mg by mouth daily.      sertraline 50 MG tablet  Commonly known as:  ZOLOFT  Take  1 tablet (50 mg total) by mouth daily.   Indication:  Major Depressive Disorder     traZODone 100 MG tablet  Commonly known as:  DESYREL  Take 1 tablet (100 mg total) by mouth at bedtime as needed and may repeat dose one time if needed for sleep.   Indication:  Trouble Sleeping, Major Depressive Disorder           Follow-up Information   Follow up with [facility]  On 03/13/2013. (You are scheduled to admit to [facility] on Friday, March 13, 2013)    Contact information:   [address] [place], [place]  [address]  [phone] or [phone]      Follow-up recommendations:  Activity:  Resume usual activities Diet:  Regular  Comments:   Take all your medications as prescribed by your mental healthcare provider.  Report any adverse effects and or reactions from your medicines to your outpatient provider promptly.  Patient is instructed and cautioned to not engage in alcohol and or illegal drug use while on prescription  medicines.  In the event of worsening symptoms, patient is instructed to call the crisis hotline, 911 and or go to the nearest ED for appropriate evaluation and treatment of symptoms.  Follow-up with your primary care provider for your other medical issues, concerns and or health care needs.     Total Discharge Time:  Greater than 30 minutes.  SignedFransisca Kaufmann ANN NP-C 03/12/2013, 4:00 PM

## 2013-03-12 NOTE — [facility] Suicide Risk Assessment (Signed)
[facility] INPATIENT:  Family/Significant Other Suicide Prevention Education  Suicide Prevention Education:  Patient Refusal for Family/Significant Other Suicide Prevention Education: The patient Sean Robinson has refused to provide written consent for family/significant other to be provided Family/Significant Other Suicide Prevention Education during admission and/or prior to discharge.  Physician notified.  Patient going into residential treatment in [place].  He would not consent to any family involvement.  Demarie Hyneman Hairston 03/12/2013, 10:02 AM

## 2013-03-12 NOTE — [facility] Notes (Signed)
Pt denies SI/HI/AVH. Pt given discharge instructions including prescriptions, medication samples, and f/u appointments. Pt also given $20 cash and bus pass.  Pt states understanding. Pt states receipt of all belongings.

## 2013-03-12 NOTE — Clinical Social Work Note (Signed)
Patient accepted for admission to [facility] in [place].  He will be assisted with a bag lunch and $20 in petty cash.

## 2013-03-12 NOTE — [facility] Notes (Signed)
D: Patient appropriate and cooperative with staff and peers. Patient's affect/mood is depressed, but brighter as the day progresses. He's attending groups and interacting with peers in the milieu. Patient is to be discharged this evening.  A: Support and encouragement provided to patient. Administered scheduled medications per ordering MD. Monitor Q15 minute checks for safety.  R: Patient receptive. Denies SI/HI/AVH. Patient remains safe on the unit.

## 2013-03-17 NOTE — [facility] Notes (Signed)
Patient Discharge Instructions:  After Visit Summary (AVS):   Faxed to:  03/17/13 Discharge Summary Note:   Faxed to:  03/17/13 Psychiatric Admission Assessment Note:   Faxed to:  03/17/13 Suicide Risk Assessment - Discharge Assessment:   Faxed to:  03/17/13 Faxed/Sent to the Next Level Care provider:  03/17/13 Faxed to [facility] @ [phone]  Jerelene Redden, 03/17/2013, 3:31 PM

## 2014-12-26 ENCOUNTER — Emergency Department (HOSPITAL_COMMUNITY)
Admission: EM | Admit: 2014-12-26 | Discharge: 2014-12-27 | Disposition: A | Discharge status: Other Acute Inpatient Hospital | Payer: Medicare Other | Attending: Emergency Medicine | Admitting: Emergency Medicine

## 2014-12-26 ENCOUNTER — Encounter (HOSPITAL_COMMUNITY): Payer: Self-pay | Admitting: *Deleted

## 2014-12-26 DIAGNOSIS — R110 Nausea: Secondary | ICD-10-CM

## 2014-12-26 DIAGNOSIS — Z79899 Other long term (current) drug therapy: Secondary | ICD-10-CM | POA: Insufficient documentation

## 2014-12-26 DIAGNOSIS — K651 Peritoneal abscess: Secondary | ICD-10-CM | POA: Diagnosis not present

## 2014-12-26 DIAGNOSIS — Z720 Tobacco use: Secondary | ICD-10-CM | POA: Insufficient documentation

## 2014-12-26 DIAGNOSIS — Z21 Asymptomatic human immunodeficiency virus [HIV] infection status: Secondary | ICD-10-CM | POA: Insufficient documentation

## 2014-12-26 DIAGNOSIS — I10 Essential (primary) hypertension: Secondary | ICD-10-CM | POA: Diagnosis not present

## 2014-12-26 DIAGNOSIS — R1110 Vomiting, unspecified: Secondary | ICD-10-CM | POA: Diagnosis present

## 2014-12-26 DIAGNOSIS — R109 Unspecified abdominal pain: Secondary | ICD-10-CM

## 2014-12-26 DIAGNOSIS — F329 Major depressive disorder, single episode, unspecified: Secondary | ICD-10-CM | POA: Insufficient documentation

## 2014-12-26 DIAGNOSIS — Z9049 Acquired absence of other specified parts of digestive tract: Secondary | ICD-10-CM | POA: Insufficient documentation

## 2014-12-26 LAB — CBC WITH DIFFERENTIAL/PLATELET
Basophils Absolute: 0 10*3/uL (ref 0.0–0.1)
Basophils Relative: 0 % (ref 0–1)
EOS PCT: 0 % (ref 0–5)
Eosinophils Absolute: 0 10*3/uL (ref 0.0–0.7)
HCT: 29.3 % — ABNORMAL LOW (ref 39.0–52.0)
HEMOGLOBIN: 10.2 g/dL — AB (ref 13.0–17.0)
LYMPHS ABS: 0.9 10*3/uL (ref 0.7–4.0)
Lymphocytes Relative: 8 % — ABNORMAL LOW (ref 12–46)
MCH: 29.9 pg (ref 26.0–34.0)
MCHC: 34.8 g/dL (ref 30.0–36.0)
MCV: 85.9 fL (ref 78.0–100.0)
MONOS PCT: 6 % (ref 3–12)
Monocytes Absolute: 0.6 10*3/uL (ref 0.1–1.0)
NEUTROS PCT: 86 % — AB (ref 43–77)
Neutro Abs: 9.2 10*3/uL — ABNORMAL HIGH (ref 1.7–7.7)
PLATELETS: 239 10*3/uL (ref 150–400)
RBC: 3.41 MIL/uL — ABNORMAL LOW (ref 4.22–5.81)
RDW: 17.7 % — AB (ref 11.5–15.5)
WBC: 10.7 10*3/uL — ABNORMAL HIGH (ref 4.0–10.5)

## 2014-12-26 LAB — COMPREHENSIVE METABOLIC PANEL
ALBUMIN: 2.5 g/dL — AB (ref 3.5–5.2)
ALK PHOS: 178 U/L — AB (ref 39–117)
ALT: 32 U/L (ref 0–53)
ANION GAP: 8 (ref 5–15)
AST: 82 U/L — AB (ref 0–37)
BILIRUBIN TOTAL: 3.8 mg/dL — AB (ref 0.3–1.2)
BUN: 23 mg/dL (ref 6–23)
CALCIUM: 8.3 mg/dL — AB (ref 8.4–10.5)
CHLORIDE: 100 mmol/L (ref 96–112)
CO2: 23 mmol/L (ref 19–32)
CREATININE: 0.89 mg/dL (ref 0.50–1.35)
GFR calc Af Amer: >90 mL/min (ref >90)
Glucose, Bld: 98 mg/dL (ref 70–99)
POTASSIUM: 5.8 mmol/L — AB (ref 3.5–5.1)
SODIUM: 131 mmol/L — AB (ref 135–145)
Total Protein: 6.4 g/dL (ref 6.0–8.3)

## 2014-12-26 LAB — LIPASE, BLOOD: Lipase: 68 U/L — ABNORMAL HIGH (ref 11–59)

## 2014-12-26 MED ORDER — ONDANSETRON HCL 4 MG/2ML IJ SOLN
4.0000 mg | Freq: Once | INTRAMUSCULAR | Status: AC
  Administered 2014-12-26: 4 mg via INTRAVENOUS
  Filled 2014-12-26: qty 2

## 2014-12-26 MED ORDER — MORPHINE SULFATE 4 MG/ML IJ SOLN
4.0000 mg | Freq: Once | INTRAMUSCULAR | Status: AC
  Administered 2014-12-26: 4 mg via INTRAVENOUS
  Filled 2014-12-26: qty 1

## 2014-12-26 MED ORDER — SODIUM CHLORIDE 0.9 % IV BOLUS (SEPSIS)
1000.0000 mL | Freq: Once | INTRAVENOUS | Status: AC
  Administered 2014-12-26: 1000 mL via INTRAVENOUS

## 2014-12-26 NOTE — ED Provider Notes (Signed)
CSN: [id number]     Arrival date & time 12/26/14  1902 History   First MD Initiated Contact with Patient 12/26/14 1906     Chief Complaint  Patient presents with  . Emesis    vomiting pink tinge (liver cancer pt)     (Consider location/radiation/quality/duration/timing/severity/associated sxs/prior Treatment) HPI Sean Robinson is a 54 year old male with past medical history of HIV, liver cancer currently on chemotherapy first treatment last Wednesday who presents the ER complaining of nausea and vomiting. Patient reports his symptoms began acutely earlier today, patient had 2 episodes of nonbilious, nonbloody vomiting and his family called 911. Patient reports consistent nausea, however has not vomited since. Patient denies associated shortness of breath, chest pain, fever, diarrhea, dysuria, hematochezia, melena.  Past Medical History  Diagnosis Date  . HIV (human immunodeficiency virus infection)   . Hypertension   . Mental disorder   . Depression   . H/O psychiatric hospitalization     [facility] [place] [facility]  . Polysubstance abuse     ETOH and crack   Past Surgical History  Procedure Laterality Date  . Cholecystectomy    . Fracture surgery      right knee surgery 15 months ago   Family History  Problem Relation Age of Onset  . Family history unknown: Yes   History  Substance Use Topics  . Smoking status: Current Every Day Smoker -- 0.25 packs/day    Types: Cigarettes  . Smokeless tobacco: Not on file  . Alcohol Use: No     Comment: 1-2x/wk "a couple of 40 oz"    Review of Systems  Constitutional: Negative for fever.  HENT: Negative for trouble swallowing.   Eyes: Negative for visual disturbance.  Respiratory: Negative for shortness of breath.   Cardiovascular: Negative for chest pain.  Gastrointestinal: Positive for nausea and vomiting. Negative for abdominal pain.  Genitourinary: Negative for dysuria.  Musculoskeletal: Negative for  neck pain.  Skin: Negative for rash.  Neurological: Negative for dizziness, weakness and numbness.  Psychiatric/Behavioral: Negative.       Allergies  Review of patient's allergies indicates no known allergies.  Home Medications   Prior to Admission medications   Medication Sig Start Date End Date Taking? Authorizing Provider  abacavir-lamiVUDine (EPZICOM) 600-300 MG per tablet Take 1 tablet by mouth daily.   Yes Historical Provider, MD  atenolol (TENORMIN) 50 MG tablet Take 50 mg by mouth daily.   Yes Historical Provider, MD  Darunavir Ethanolate (PREZISTA) 800 MG tablet Take 800 mg by mouth daily.   Yes Historical Provider, MD  Enoxaparin Sodium (LOVENOX IJ) Inject 1 Dose as directed every 12 (twelve) hours.   Yes Historical Provider, MD  potassium chloride SA (K-DUR,KLOR-CON) 20 MEQ tablet Take 20 mEq by mouth daily.   Yes Historical Provider, MD  ritonavir (NORVIR) 100 MG capsule Take 100 mg by mouth daily.   Yes Historical Provider, MD  traZODone (DESYREL) 100 MG tablet Take 1 tablet (100 mg total) by mouth at bedtime as needed and may repeat dose one time if needed for sleep. 03/12/13  Yes Thermon LeylandLaura A Davis, NP  sertraline (ZOLOFT) 50 MG tablet Take 1 tablet (50 mg total) by mouth daily. 03/12/13   Thermon LeylandLaura A Davis, NP   BP 135/94 mmHg  Pulse 84  Temp(Src) 99.5 F (37.5 C) (Oral)  Resp 16  SpO2 99% Physical Exam  Constitutional: He is oriented to person, place, and time. He appears well-developed and well-nourished.  No distress.  HENT:  Head: Normocephalic and atraumatic.  Mouth/Throat: Oropharynx is clear and moist. No oropharyngeal exudate.  Eyes: Right eye exhibits no discharge. Left eye exhibits no discharge. No scleral icterus.  Neck: Normal range of motion.  Cardiovascular: Normal rate, regular rhythm and normal heart sounds.   No murmur heard. Pulmonary/Chest: Effort normal and breath sounds normal. No respiratory distress.  Abdominal: Soft. There is generalized  tenderness. There is no rigidity, no guarding, no tenderness at McBurney's point and negative Murphy's sign.  Diffuse generalized tenderness in upper abdomen with tenderness mildly more prominent in epigastrium.  Musculoskeletal: Normal range of motion. He exhibits no edema or tenderness.  Neurological: He is alert and oriented to person, place, and time. No cranial nerve deficit. Coordination normal. GCS eye subscore is 4. GCS verbal subscore is 5. GCS motor subscore is 6.  Patient fully alert, answering questions appropriately in full, clear sentences. Cranial nerves II through XII grossly intact. Motor strength 5 out of 5 in all major muscle groups of upper and lower extremity. Distal sensation intact.  Skin: Skin is warm and dry. No rash noted. He is not diaphoretic.  Psychiatric: He has a normal mood and affect.  Nursing note and vitals reviewed.   ED Course  Procedures (including critical care time) Labs Review Labs Reviewed  COMPREHENSIVE METABOLIC PANEL - Abnormal; Notable for the following:    Sodium 131 (*)    Potassium 5.8 (*)    Calcium 8.3 (*)    Albumin 2.5 (*)    AST 82 (*)    Alkaline Phosphatase 178 (*)    Total Bilirubin 3.8 (*)    All other components within normal limits  LIPASE, BLOOD - Abnormal; Notable for the following:    Lipase 68 (*)    All other components within normal limits  CBC WITH DIFFERENTIAL/PLATELET - Abnormal; Notable for the following:    WBC 10.7 (*)    RBC 3.41 (*)    Hemoglobin 10.2 (*)    HCT 29.3 (*)    RDW 17.7 (*)    Neutrophils Relative % 86 (*)    Lymphocytes Relative 8 (*)    Neutro Abs 9.2 (*)    All other components within normal limits  CBC WITH DIFFERENTIAL/PLATELET  URINALYSIS, ROUTINE W REFLEX MICROSCOPIC    Imaging Review No results found.   EKG Interpretation None      MDM   Final diagnoses:  Abdominal pain    Patient here with nausea, vomiting and generalized abdominal pain. Patient status post first  chemotherapy treatment for liver cancer. Labs do not appear as showing evidence evidence of acute pathology, however patient having diffuse abdominal tenderness more significant on right upper quadrant after pain medication and nausea medication. Will follow-up with CT abdomen pelvis. Patient signed out to Ailene Ravel, PA-C with plan to follow-up on results of CT abdomen pelvis and discharge patient home with symptomatically therapy if negative.  BP 135/94 mmHg  Pulse 84  Temp(Src) 99.5 F (37.5 C) (Oral)  Resp 16  SpO2 99%  Signed,  Ladona Mow, PA-C 1:31 AM  Patient seen and discussed with Dr. Azalia Bilis, MD.     Monte Fantasia, PA-C 12/27/14 1610  Lyanne Co, MD 12/29/14 [phone]

## 2014-12-26 NOTE — ED Notes (Signed)
Bed: [id number] Expected date: 12/26/14 Expected time: 6:49 PM Means of arrival: Ambulance Comments: Ca pt, vomiting

## 2014-12-26 NOTE — ED Notes (Signed)
Patient vomiting pink tinged emesis X 2. Has hx of liver cancer second round of chemo Thursday.

## 2014-12-27 ENCOUNTER — Emergency Department (HOSPITAL_COMMUNITY): Payer: Medicare Other

## 2014-12-27 ENCOUNTER — Encounter (HOSPITAL_COMMUNITY): Payer: Self-pay | Admitting: *Deleted

## 2014-12-27 DIAGNOSIS — Z79899 Other long term (current) drug therapy: Secondary | ICD-10-CM | POA: Diagnosis not present

## 2014-12-27 DIAGNOSIS — I10 Essential (primary) hypertension: Secondary | ICD-10-CM | POA: Diagnosis not present

## 2014-12-27 DIAGNOSIS — Z9049 Acquired absence of other specified parts of digestive tract: Secondary | ICD-10-CM | POA: Diagnosis not present

## 2014-12-27 DIAGNOSIS — R1110 Vomiting, unspecified: Secondary | ICD-10-CM | POA: Diagnosis present

## 2014-12-27 DIAGNOSIS — K651 Peritoneal abscess: Secondary | ICD-10-CM | POA: Diagnosis not present

## 2014-12-27 DIAGNOSIS — Z21 Asymptomatic human immunodeficiency virus [HIV] infection status: Secondary | ICD-10-CM | POA: Diagnosis not present

## 2014-12-27 DIAGNOSIS — Z720 Tobacco use: Secondary | ICD-10-CM | POA: Diagnosis not present

## 2014-12-27 DIAGNOSIS — F329 Major depressive disorder, single episode, unspecified: Secondary | ICD-10-CM | POA: Diagnosis not present

## 2014-12-27 MED ORDER — SODIUM CHLORIDE 0.9 % IV BOLUS (SEPSIS)
1000.0000 mL | Freq: Once | INTRAVENOUS | Status: AC
  Administered 2014-12-27: 1000 mL via INTRAVENOUS

## 2014-12-27 MED ORDER — IBUPROFEN 800 MG PO TABS
800.0000 mg | ORAL_TABLET | Freq: Once | ORAL | Status: AC
  Administered 2014-12-27: 800 mg via ORAL
  Filled 2014-12-27: qty 1

## 2014-12-27 MED ORDER — VANCOMYCIN HCL IN DEXTROSE 750-5 MG/150ML-% IV SOLN: 750.0000 mg | Freq: Three times a day (TID) | INTRAVENOUS | Status: DC

## 2014-12-27 MED ORDER — IOHEXOL 300 MG/ML  SOLN
50.0000 mL | Freq: Once | INTRAMUSCULAR | Status: AC | PRN
  Administered 2014-12-27: 50 mL via ORAL

## 2014-12-27 MED ORDER — VANCOMYCIN HCL IN DEXTROSE 1-5 GM/200ML-% IV SOLN
1000.0000 mg | Freq: Once | INTRAVENOUS | Status: AC
  Administered 2014-12-27: 1000 mg via INTRAVENOUS
  Filled 2014-12-27: qty 200

## 2014-12-27 MED ORDER — DEXTROSE 5 % IV SOLN
2.0000 g | Freq: Once | INTRAVENOUS | Status: AC
  Administered 2014-12-27: 2 g via INTRAVENOUS
  Filled 2014-12-27: qty 2

## 2014-12-27 MED ORDER — IOHEXOL 300 MG/ML  SOLN
100.0000 mL | Freq: Once | INTRAMUSCULAR | Status: AC | PRN
  Administered 2014-12-27: 100 mL via INTRAVENOUS

## 2014-12-27 MED ORDER — DEXTROSE 5 % IV SOLN: 2.0000 g | Freq: Three times a day (TID) | INTRAVENOUS | Status: DC

## 2014-12-27 NOTE — [facility] Notes (Signed)
ANTIBIOTIC CONSULT NOTE - INITIAL  Pharmacy Consult for vancomycin, cefepime Indication: abdominal infection  No Known Allergies  Patient Measurements: Height: 6\' 4"  (193 cm) Weight: 160 lb (72.576 kg) IBW/kg (Calculated) : 86.8 Adjusted Body Weight:   Vital Signs: Temp: 100.2 F (37.9 C) (01/25 0446) Temp Source: Oral (01/25 0446) BP: 139/84 mmHg (01/25 0446) Pulse Rate: 82 (01/25 0446) Intake/Output from previous day:   Intake/Output from this shift:    Labs:  Recent Labs  12/26/14 1936 12/26/14 2145  WBC  --  10.7*  HGB  --  10.2*  PLT  --  239  CREATININE 0.89  --    Estimated Creatinine Clearance: 96.3 mL/min (by C-G formula based on Cr of 0.89). No results for input(s): VANCOTROUGH, VANCOPEAK, VANCORANDOM, GENTTROUGH, GENTPEAK, GENTRANDOM, TOBRATROUGH, TOBRAPEAK, TOBRARND, AMIKACINPEAK, AMIKACINTROU, AMIKACIN in the last 72 hours.   Microbiology: No results found for this or any previous visit (from the past 720 hour(s)).  Medical History: Past Medical History  Diagnosis Date  . HIV (human immunodeficiency virus infection)   . Hypertension   . Mental disorder   . Depression   . H/O psychiatric hospitalization     [facility] [place] [facility]  . Polysubstance abuse     ETOH and crack    Medications:  Anti-infectives    Start     Dose/Rate Route Frequency Ordered Stop   12/27/14 0245  ceFEPIme (MAXIPIME) 2 g in dextrose 5 % 50 mL IVPB     2 g100 mL/hr over 30 Minutes Intravenous  Once 12/27/14 0231 12/27/14 0310   12/27/14 0245  vancomycin (VANCOCIN) IVPB 1000 mg/200 mL premix     1,000 mg200 mL/hr over 60 Minutes Intravenous  Once 12/27/14 0231 12/27/14 0420     Assessment: Patient with HIV and liver cancer.  Pharmacy to dose antibiotics for abdominal infection.  First dose of antibiotics already given.  Patient with good renal function.  Goal of Therapy:  Vancomycin trough level 15-20 mcg/ml  Cefepime dosed based on  patient weight and renal function   Plan:  Measure antibiotic drug levels at steady state Follow up culture results vancomycin 750mg  iv q8hr  Cefepime 2gm iv q8hr  Darlina GuysGrimsley Jr, Aerin Delany Crowford 12/27/2014,5:21 AM

## 2014-12-27 NOTE — ED Notes (Signed)
Patient returned from CT scan.

## 2014-12-27 NOTE — ED Provider Notes (Signed)
Care assumed from Ladona MowJoe Mintz, PA-C at shift change. Pt with hx of liver CA, on chemo, with n/v. See HPI from Joe's note. Abdominal CT pending. If normal, plan to d/c home. Labs noted to have hyperkalemia of 5.8. EKG without any acute findings. Patient given a second liter fluid bolus. 3:06 AM CT scan showing a large circumscribed collection posterior to the stomach containing heterogeneous density and gas suggesting hematoma, necrotic neoplasm or abscess. Possible proximal small bowel wall thickening possibly indicating enteritis. Given patient has a temperature of 100.9, leukocytosis with left shift, broad-spectrum antibiotics started (vanc/cefepime). [facility], I spoke with [facility], Dr. Fredrich BirksShayn Martin, who accepts patient for transfer. Patient stable for transfer. He will arrive through their emergency department. At this time, pt is resting comfortably in NAD. Abdomen is tender epigastric region. No peritoneal signs.  Discussed with attending Dr. Elesa MassedWard who agrees with plan of care.  Ct Abdomen Pelvis W Contrast  12/27/2014   CLINICAL DATA:  Pink tinged emesis x2. Patient having chemotherapy for liver cancer. White cell count 10.7. Elevated lipase. History of hypertension, HIV, and cholecystectomy.  EXAM: CT ABDOMEN AND PELVIS WITH CONTRAST  TECHNIQUE: Multidetector CT imaging of the abdomen and pelvis was performed using the standard protocol following bolus administration of intravenous contrast.  CONTRAST:  100mL OMNIPAQUE IOHEXOL 300 MG/ML  SOLN  COMPARISON:  None.  FINDINGS: Focal infiltration in the right lung base could be due to atelectasis or infection.  Heterogeneous nodular infiltration throughout the lateral segment left lobe of the liver with less prominent involvement of the medial segment left lobe and scattered nodules throughout the right lobe of the liver compatible with known malignancy. Portal venous thrombus in the left portal vein may  indicate tumor thrombus. Main portal vein is patent. Wedge-shaped poorly defined low-attenuation in the posterior spleen may represent infarct or neoplasm. Surgical absence of the gallbladder. No bile duct dilatation. Pancreas, adrenal glands, inferior vena cava, and abdominal aorta are unremarkable. Retroperitoneal lymph nodes are not pathologically enlarged. Multiple renal stones bilaterally. No hydronephrosis or ureteral stones. Stomach appears intact although decompressed. There is an elongated collection running adjacent to the stomach extending from the upper stomach along the lesser curvature and down anterior to the left perirenal space. This collection is heterogeneous areas of increased density and gas. The lesion measures up to about 3 x 5.2 x 11.4 cm. This could represent abscess, necrotic neoplasm, or other loculated fluid collection or hematoma. Presence of gas suggest possible fistula to the stomach. Proximal small bowel or incompletely distended. Suggestion of small bowel wall thickening possibly due to ileus. Distal small bowel appear normal. Contrast material flows to the colon without evidence of obstruction. Small amount of free fluid in the abdomen. No free intra-abdominal air.  Pelvis: Small amount of free fluid in the pelvis. Prostate gland is not enlarged. Bladder wall is not thickened. Appendix is not identified. No pelvic mass or lymphadenopathy. Normal alignment of the lumbar spine. No destructive bone lesions appreciated.  IMPRESSION: Diffuse nodular changes throughout the liver, most prominent in the lateral segment left lobe, consistent with known neoplasm. Thrombus in the left portal vein may represent tumor thrombus. Large circumscribed collection posterior to the stomach containing heterogeneous density and gas suggesting hematoma, necrotic neoplasm, or abscess. Possible proximal small bowel wall thickening may indicate enteritis. Mild diffuse abdominal and pelvic free fluid.  Bilateral nonobstructing renal stones. Mass versus infarct in the posterior spleen.   Electronically  Signed   By: Burman Nieves M.D.   On: 12/27/2014 02:18   Results for orders placed or performed during the hospital encounter of 12/26/14  Comprehensive metabolic panel  Result Value Ref Range   Sodium 131 (L) 135 - 145 mmol/L   Potassium 5.8 (H) 3.5 - 5.1 mmol/L   Chloride 100 96 - 112 mmol/L   CO2 23 19 - 32 mmol/L   Glucose, Bld 98 70 - 99 mg/dL   BUN 23 6 - 23 mg/dL   Creatinine, Ser 1.61 0.50 - 1.35 mg/dL   Calcium 8.3 (L) 8.4 - 10.5 mg/dL   Total Protein 6.4 6.0 - 8.3 g/dL   Albumin 2.5 (L) 3.5 - 5.2 g/dL   AST 82 (H) 0 - 37 U/L   ALT 32 0 - 53 U/L   Alkaline Phosphatase 178 (H) 39 - 117 U/L   Total Bilirubin 3.8 (H) 0.3 - 1.2 mg/dL   GFR calc non Af Amer >90 >90 mL/min   GFR calc Af Amer >90 >90 mL/min   Anion gap 8 5 - 15  Lipase, blood  Result Value Ref Range   Lipase 68 (H) 11 - 59 U/L  CBC with Differential/Platelet  Result Value Ref Range   WBC 10.7 (H) 4.0 - 10.5 K/uL   RBC 3.41 (L) 4.22 - 5.81 MIL/uL   Hemoglobin 10.2 (L) 13.0 - 17.0 g/dL   HCT 09.6 (L) 04.5 - 40.9 %   MCV 85.9 78.0 - 100.0 fL   MCH 29.9 26.0 - 34.0 pg   MCHC 34.8 30.0 - 36.0 g/dL   RDW 81.1 (H) 91.4 - 78.2 %   Platelets 239 150 - 400 K/uL   Neutrophils Relative % 86 (H) 43 - 77 %   Lymphocytes Relative 8 (L) 12 - 46 %   Monocytes Relative 6 3 - 12 %   Eosinophils Relative 0 0 - 5 %   Basophils Relative 0 0 - 1 %   Neutro Abs 9.2 (H) 1.7 - 7.7 K/uL   Lymphs Abs 0.9 0.7 - 4.0 K/uL   Monocytes Absolute 0.6 0.1 - 1.0 K/uL   Eosinophils Absolute 0.0 0.0 - 0.7 K/uL   Basophils Absolute 0.0 0.0 - 0.1 K/uL   RBC Morphology SCHISTOCYTES PRESENT (2-5/hpf)    WBC Morphology TOXIC GRANULATION     Kathrynn Speed, PA-C 12/27/14 0308  Kathrynn Speed, PA-C 12/27/14 0309  Layla Maw Ward, DO 12/27/14 9562

## 2014-12-27 NOTE — ED Notes (Signed)
Report given to [facility]  

## 2014-12-27 NOTE — ED Notes (Signed)
Resting quietly with eye closed. Easily arousable. Verbally responsive. Resp even and unlabored. ABC's intact. SR on monitor at 86bpm.  NAD noted. IV infusing Maxipime at 14800ml/hr without difficulty.

## 2014-12-27 NOTE — ED Notes (Signed)
Resting quietly with eye closed. Easily arousable. Verbally responsive. Resp even and unlabored. ABC's intact. NAD noted. Pt aware of transfer to [facility].

## 2014-12-27 NOTE — ED Notes (Signed)
Resting quietly with eye closed. Easily arousable. Verbally responsive. Resp even and unlabored. ABC's intact.IV infusing NS at 96299ml/hr and Vancomycin at 23600ml/hr without difficulty. Pt had no adverse reaction to ABT x2 noted. NAD noted.

## 2015-03-04 DEATH — deceased

## 2016-08-04 IMAGING — CT CT ABD-PELV W/ CM
1 of 3 series · 13 of 32 positions shown, 18 images · IV contrast (100 ML OMNI 300)
Comparison: None.

CLINICAL DATA: Pink tinged emesis x2. Patient having chemotherapy
for liver cancer. White cell count 10.7. Elevated lipase. History of
hypertension, HIV, and cholecystectomy.

EXAM:
CT ABDOMEN AND PELVIS WITH CONTRAST
TECHNIQUE: Multidetector CT imaging of the abdomen and pelvis was performed
using the standard protocol following bolus administration of
intravenous contrast.
CONTRAST:  100mL OMNIPAQUE IOHEXOL 300 MG/ML  SOLN

[Series 2: abd/pel with · axial · 0.68mm/px · z∈[-416,+4]mm · 13 of 94 slices shown, 18 images]
[im 5/94  soft-tissue]
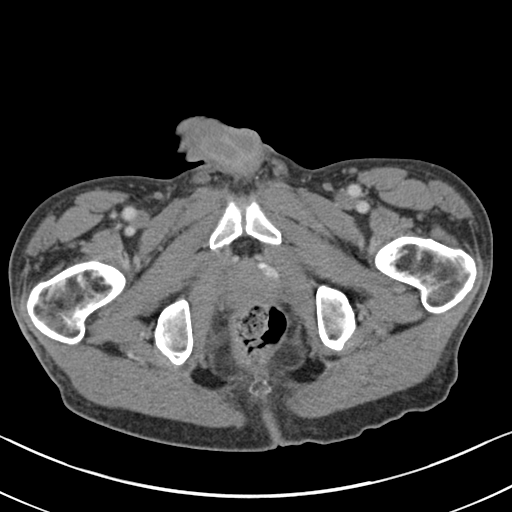
[im 5/94  bone]
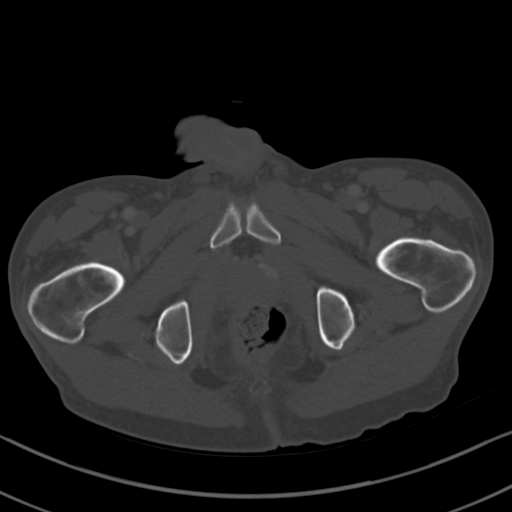
[im 14/94  soft-tissue]
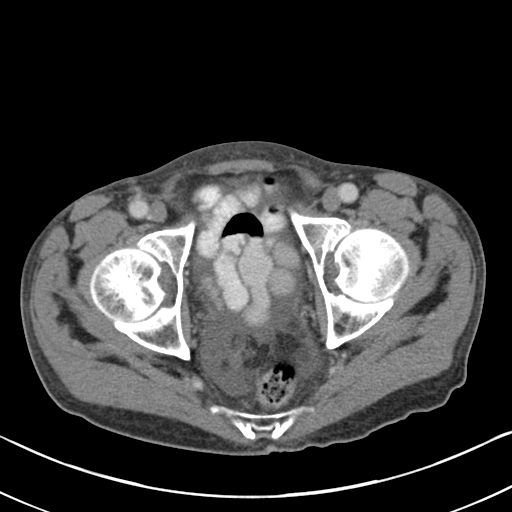
[im 19/94  soft-tissue]
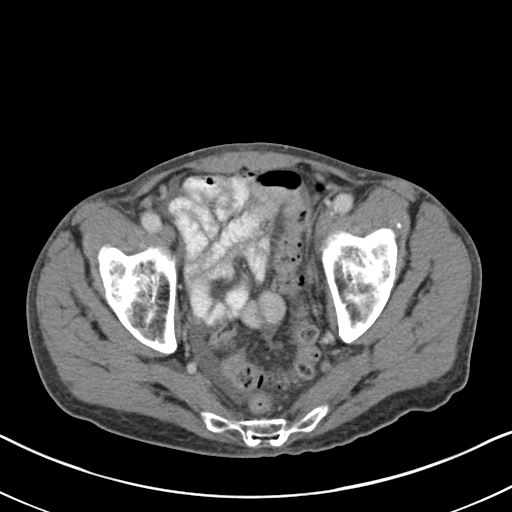
[im 28/94  soft-tissue]
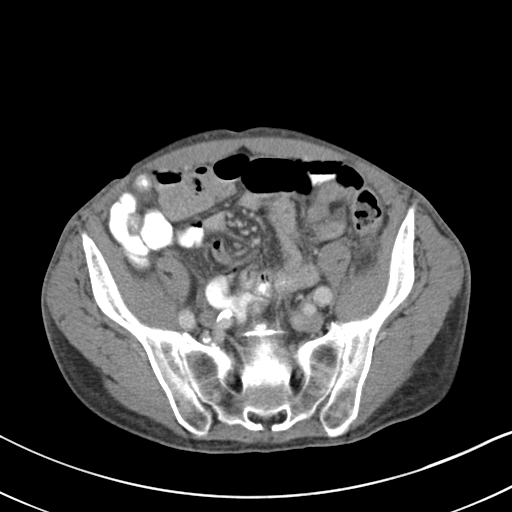
[im 38/94  soft-tissue]
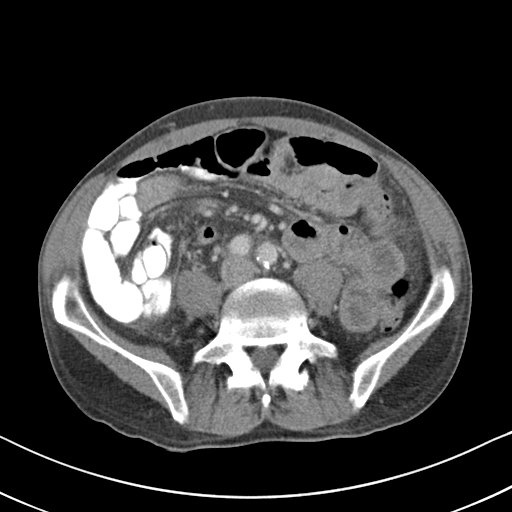
[im 42/94  soft-tissue]
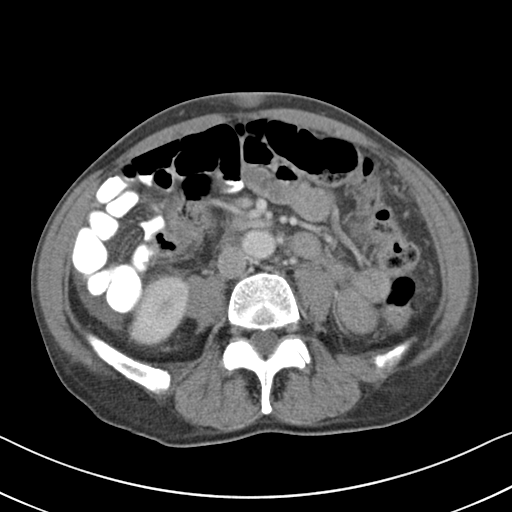
[im 52/94  soft-tissue]
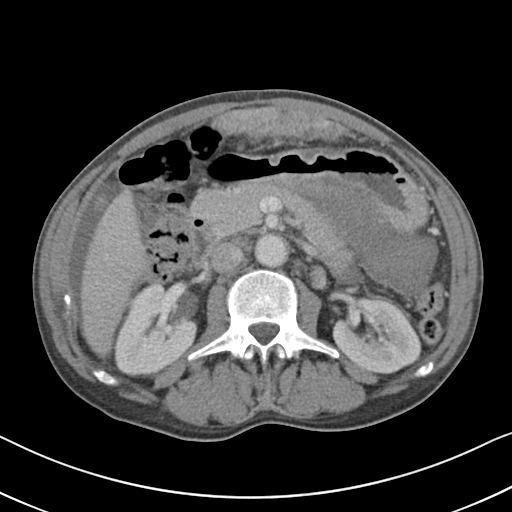
[im 56/94  soft-tissue]
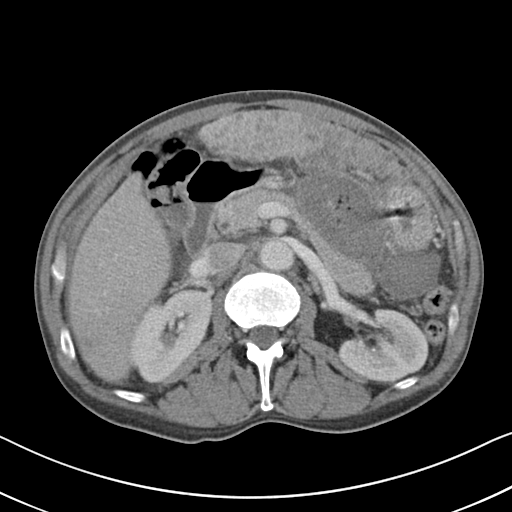
[im 66/94  soft-tissue]
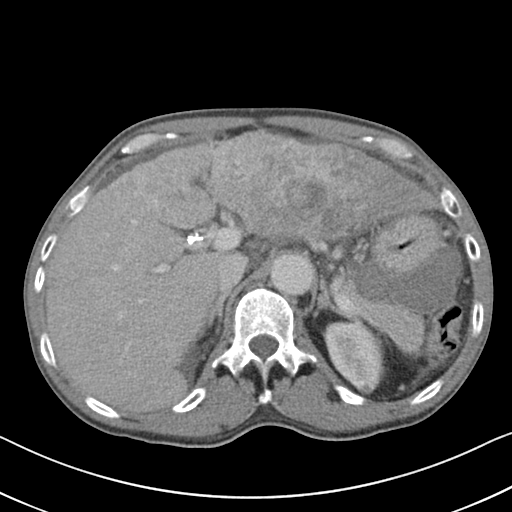
[im 66/94  bone]
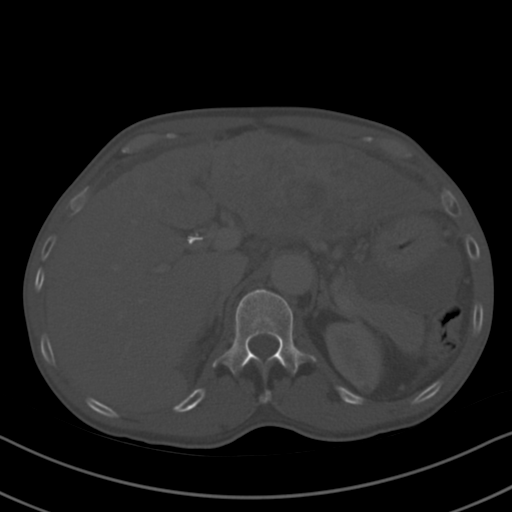
[im 75/94  soft-tissue]
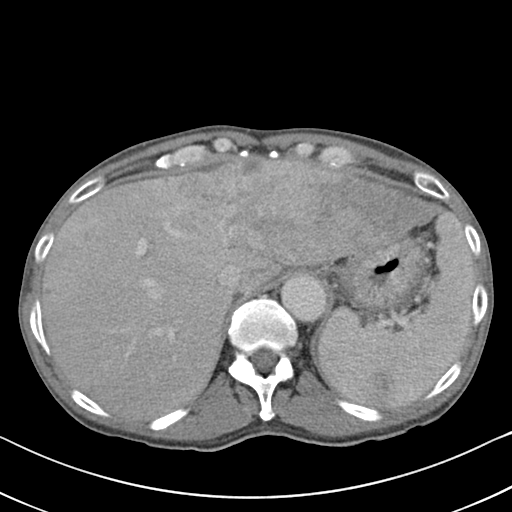
[im 75/94  lung]
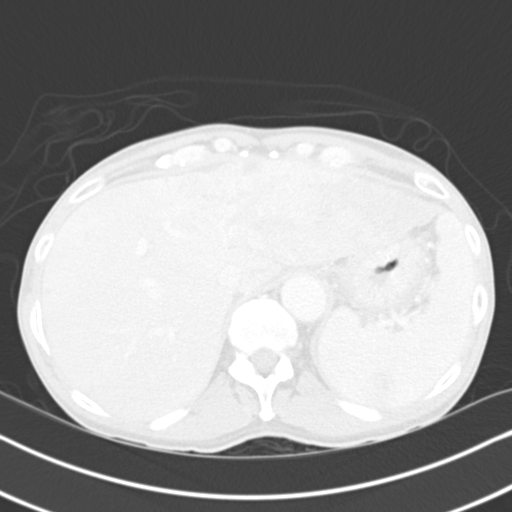
[im 80/94  soft-tissue]
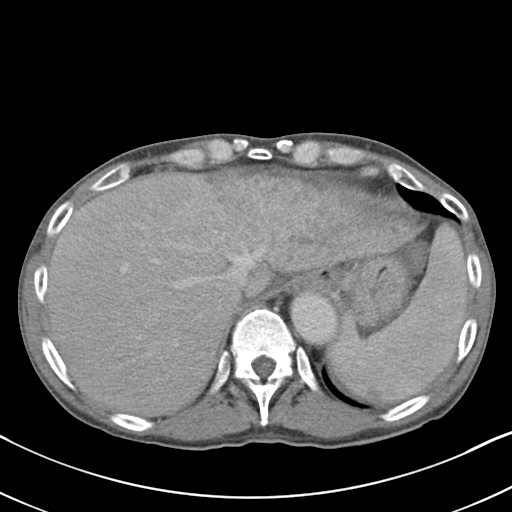
[im 80/94  lung]
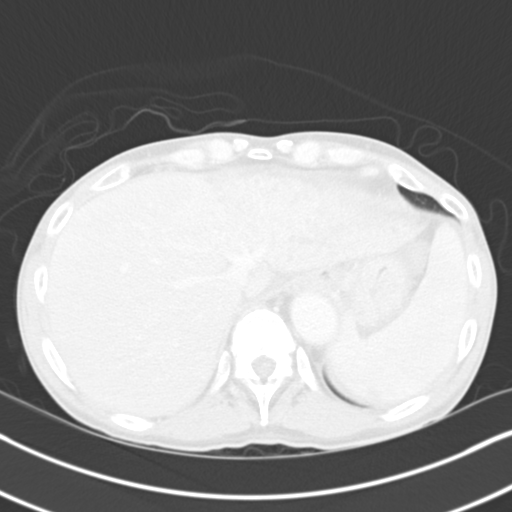
[im 84/94  lung]
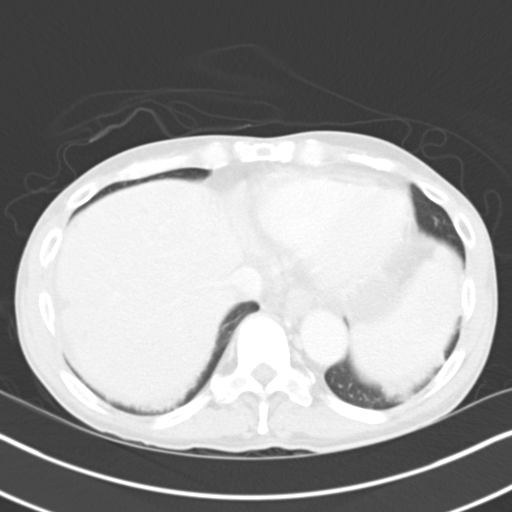
[im 89/94  soft-tissue]
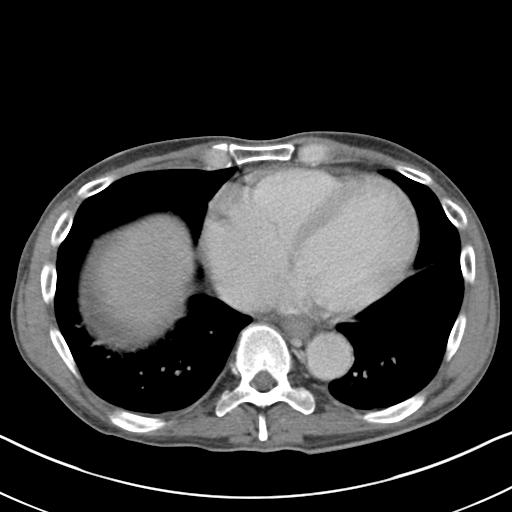
[im 89/94  lung]
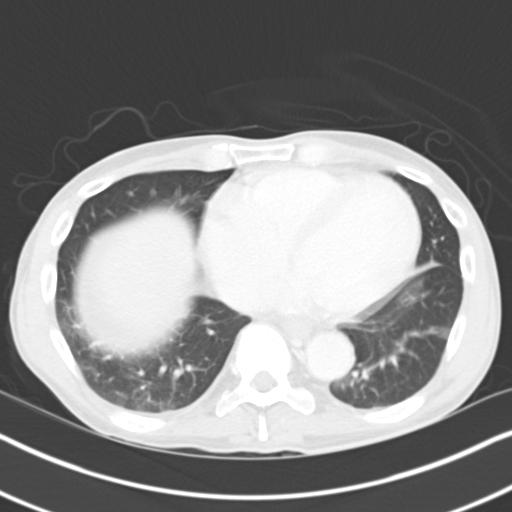

[13 of 32 positions shown; findings below may reference images not displayed]

FINDINGS: Focal infiltration in the right lung base could be due to
atelectasis or infection.

Heterogeneous nodular infiltration throughout the lateral segment
left lobe of the liver with less prominent involvement of the medial
segment left lobe and scattered nodules throughout the right lobe of
the liver compatible with known malignancy. Portal venous thrombus
in the left portal vein may indicate tumor thrombus. Main portal
vein is patent. Wedge-shaped poorly defined low-attenuation in the
posterior spleen may represent infarct or neoplasm. Surgical absence
of the gallbladder. No bile duct dilatation. Pancreas, adrenal
glands, inferior vena cava, and abdominal aorta are unremarkable.
Retroperitoneal lymph nodes are not pathologically enlarged.
Multiple renal stones bilaterally. No hydronephrosis or ureteral
stones. Stomach appears intact although decompressed. There is an
elongated collection running adjacent to the stomach extending from
the upper stomach along the lesser curvature and down anterior to
the left perirenal space. This collection is heterogeneous areas of
increased density and gas. The lesion measures up to about 3 x 5.2 x
11.4 cm. This could represent abscess, necrotic neoplasm, or other
loculated fluid collection or hematoma. Presence of gas suggest
possible fistula to the stomach. Proximal small bowel or
incompletely distended. Suggestion of small bowel wall thickening
possibly due to ileus. Distal small bowel appear normal. Contrast
material flows to the colon without evidence of obstruction. Small
amount of free fluid in the abdomen. No free intra-abdominal air.

Pelvis: Small amount of free fluid in the pelvis. Prostate gland is
not enlarged. Bladder wall is not thickened. Appendix is not
identified. No pelvic mass or lymphadenopathy. Normal alignment of
the lumbar spine. No destructive bone lesions appreciated.
IMPRESSION: Diffuse nodular changes throughout the liver, most prominent in the
lateral segment left lobe, consistent with known neoplasm. Thrombus
in the left portal vein may represent tumor thrombus. Large
circumscribed collection posterior to the stomach containing
heterogeneous density and gas suggesting hematoma, necrotic
neoplasm, or abscess. Possible proximal small bowel wall thickening
may indicate enteritis. Mild diffuse abdominal and pelvic free
fluid. Bilateral nonobstructing renal stones. Mass versus infarct in
the posterior spleen.
